# Patient Record
Sex: Male | Born: 1990 | Race: White | Hispanic: Yes | Marital: Married | Smoking: Former smoker
Health system: Southern US, Community
[De-identification: ages and names within clinical notes are randomized; demographics above are authoritative.]

## PROBLEM LIST (undated history)

## (undated) DIAGNOSIS — F32A Depression, unspecified: Secondary | ICD-10-CM

## (undated) DIAGNOSIS — T7840XA Allergy, unspecified, initial encounter: Secondary | ICD-10-CM

## (undated) DIAGNOSIS — F329 Major depressive disorder, single episode, unspecified: Secondary | ICD-10-CM

## (undated) DIAGNOSIS — I10 Essential (primary) hypertension: Secondary | ICD-10-CM

## (undated) HISTORY — DX: Depression, unspecified: F32.A

## (undated) HISTORY — DX: Allergy, unspecified, initial encounter: T78.40XA

## (undated) HISTORY — DX: Major depressive disorder, single episode, unspecified: F32.9

## (undated) HISTORY — PX: NO PAST SURGERIES: SHX2092

---
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
## 2011-01-17 ENCOUNTER — Emergency Department (HOSPITAL_COMMUNITY)
Admission: EM | Admit: 2011-01-17 | Discharge: 2011-01-18 | Disposition: A | Discharge status: Home / Self Care | Payer: BC Managed Care – PPO | Attending: Emergency Medicine | Admitting: Emergency Medicine

## 2011-01-17 ENCOUNTER — Encounter: Payer: Self-pay | Admitting: *Deleted

## 2011-01-17 DIAGNOSIS — J36 Peritonsillar abscess: Secondary | ICD-10-CM

## 2011-01-17 DIAGNOSIS — J029 Acute pharyngitis, unspecified: Secondary | ICD-10-CM | POA: Insufficient documentation

## 2011-01-17 MED ORDER — CLINDAMYCIN HCL 150 MG PO CAPS: 300.0000 mg | ORAL_CAPSULE | Freq: Four times a day (QID) | ORAL | Status: AC

## 2011-01-17 MED ORDER — DEXAMETHASONE 1 MG/ML PO CONC: 10.0000 mg | Freq: Once | ORAL | Status: DC

## 2011-01-17 MED ORDER — CLINDAMYCIN PHOSPHATE 600 MG/4ML IJ SOLN
INTRAMUSCULAR | Status: AC
  Filled 2011-01-17: qty 4

## 2011-01-17 MED ORDER — HYDROMORPHONE HCL PF 2 MG/ML IJ SOLN: 2.0000 mg | Freq: Once | INTRAMUSCULAR | Status: DC

## 2011-01-17 MED ORDER — DEXAMETHASONE 6 MG PO TABS
10.0000 mg | ORAL_TABLET | ORAL | Status: DC
  Filled 2011-01-17: qty 1

## 2011-01-17 MED ORDER — IBUPROFEN 600 MG PO TABS: 600.0000 mg | ORAL_TABLET | Freq: Four times a day (QID) | ORAL | Status: AC | PRN

## 2011-01-17 MED ORDER — DEXTROSE 5 % IV SOLN
INTRAVENOUS | Status: AC
  Filled 2011-01-17: qty 50

## 2011-01-17 MED ORDER — CLINDAMYCIN PHOSPHATE 600 MG/50ML IV SOLN
600.0000 mg | Freq: Once | INTRAVENOUS | Status: AC
  Administered 2011-01-17: 600 mg via INTRAVENOUS

## 2011-01-17 MED ORDER — HYDROCODONE-ACETAMINOPHEN 5-500 MG PO TABS: 1.0000 | ORAL_TABLET | Freq: Four times a day (QID) | ORAL | Status: AC | PRN

## 2011-01-17 MED ORDER — HYDROMORPHONE HCL PF 1 MG/ML IJ SOLN
1.0000 mg | Freq: Once | INTRAMUSCULAR | Status: AC
  Administered 2011-01-17: 1 mg via INTRAVENOUS
  Filled 2011-01-17: qty 1

## 2011-01-17 NOTE — ED Provider Notes (Signed)
I saw and evaluated the patient, reviewed the resident's note and I agree with the findings and plan. 20 year old, male, complains of sore throat since Friday.  Progressive.  No cough, no nausea, vomiting.  On examination, is significant tenderness.  Tremulousness, with right-sided tonsillar edema and uvula deviation to the left.  He got a classic hot potato voice.  This is all consistent with a peritonsillar abscess.  We will consult Dr. Jenne Pane for I&D of a peritonsillar abscess.  An effort to him whether or not.  He wants a CAT scan first  Nicholes Stairs, MD 01/17/11 2246

## 2011-01-17 NOTE — ED Provider Notes (Signed)
History     CSN: [id number] Arrival date & time: 01/17/2011  6:52 PM   First MD Initiated Contact with Patient 01/17/11 2156      Chief Complaint  Patient presents with  . Sore Throat    (Consider location/radiation/quality/duration/timing/severity/associated sxs/prior treatment) The history is provided by the patient.   Patient is a 20 year old male who presents with sore throat. Sore throat started about 5 days ago. Over the last couple days it has definitely worsened. It has been constant. His right side of his throat is sore. He has pain with swallowing but is able to tolerate by mouth. No change in voice. No trouble breathing. No fever. Mild pain with opening his mouth but able to open fully. No cough other respiratory complaints. No rash.  No sick contacts reported. Overall the severity is noted to be moderate. Nothing has been noted to make it better or worse.   History reviewed. No pertinent past medical history.  History reviewed. No pertinent past surgical history.  History reviewed. No pertinent family history.  History  Substance Use Topics  . Smoking status: Passive Smoker  . Smokeless tobacco: Not on file  . Alcohol Use:       Review of Systems  Constitutional: Negative for fever, chills and activity change.  HENT: Negative for congestion, neck pain and neck stiffness.   Respiratory: Negative for cough, chest tightness, shortness of breath and wheezing.   Cardiovascular: Negative for chest pain.  Gastrointestinal: Negative for nausea, vomiting, abdominal pain, diarrhea and abdominal distention.  Genitourinary: Negative for difficulty urinating.  Musculoskeletal: Negative for gait problem.  Skin: Negative for rash.  Neurological: Negative for weakness and numbness.  Psychiatric/Behavioral: Negative for behavioral problems and confusion.  All other systems reviewed and are negative.    Allergies  Review of patient's allergies indicates no known  allergies.  Home Medications   Current Outpatient Rx  Name Route Sig Dispense Refill  . AZITHROMYCIN 250 MG PO TABS Oral Take 250 mg by mouth daily. Take 2 on day 1, then 1 tab on days 2-5. Started on 12/10     . LORATADINE 10 MG PO TABS Oral Take 10 mg by mouth daily.      Marland Kitchen CLINDAMYCIN HCL 150 MG PO CAPS Oral Take 2 capsules (300 mg total) by mouth every 6 (six) hours. 80 capsule 0  . HYDROCODONE-ACETAMINOPHEN 5-500 MG PO TABS Oral Take 1-2 tablets by mouth every 6 (six) hours as needed for pain. 15 tablet 0  . IBUPROFEN 600 MG PO TABS Oral Take 1 tablet (600 mg total) by mouth every 6 (six) hours as needed for pain. 30 tablet 0    BP 136/82  Pulse 91  Temp(Src) 99.7 F (37.6 C) (Oral)  Resp 16  SpO2 98%  Physical Exam  Nursing note and vitals reviewed. Constitutional: He is oriented to person, place, and time. He appears well-developed and well-nourished. No distress.  HENT:  Head: Normocephalic.  Nose: Nose normal.       Right-sided pharyngeal erythema. Soft palate asymmetry. Fluctuance over her right soft palate. No respiratory compromise. No significant trismus.  Eyes: EOM are normal. Pupils are equal, round, and reactive to light.  Neck: Normal range of motion.       No lymphadenopathy  Cardiovascular: Regular rhythm and intact distal pulses.   Pulmonary/Chest: Effort normal and breath sounds normal. No respiratory distress. He has no wheezes. He has no rales.  Abdominal: He exhibits no distension.  No visible injury  Musculoskeletal: Normal range of motion. He exhibits no edema and no tenderness.  Neurological: He is alert and oriented to person, place, and time.       Normal strength  Skin: Skin is warm and dry. No rash noted. He is not diaphoretic.  Psychiatric: He has a normal mood and affect. His behavior is normal. Thought content normal.    ED Course  Procedures (including critical care time)  Labs Reviewed - No data to display No results found.   1.  Peritonsillar abscess       MDM   Patient with right peritonsillar abscess.  Patient is afebrile and nontoxic appearing. No respiratory compromise. Handling secretions and tolerating by mouth. I discussed patient's case with on-call ENT (Dr. Jenne Pane).  He recommends IV clindamycin, Decadron. He will see patient this morning in clinic in one drain PTA there. As mentioned, patient is well-appearing and having no respiratory difficulty and tolerating by mouth. Stable for discharge and close ENT followup. Return precautions discussed        Milus Glazier 01/18/11 0115

## 2011-01-17 NOTE — ED Notes (Addendum)
To ed for eval of sore throat. States he was seen by pmd yesterday and given meds for congestion. Fever on Sunday but not since. Airway patent. Difficulty swallowing per pt. Pt states right side of throat is very painful.

## 2011-01-18 NOTE — ED Provider Notes (Signed)
I saw and evaluated the patient, reviewed the resident's note and I agree with the findings and plan.  Jahaira Earnhart P Miosha Behe, MD 01/18/11 0936 

## 2013-02-14 ENCOUNTER — Encounter (HOSPITAL_COMMUNITY): Payer: Self-pay | Admitting: Emergency Medicine

## 2013-02-14 DIAGNOSIS — R55 Syncope and collapse: Secondary | ICD-10-CM | POA: Insufficient documentation

## 2013-02-14 DIAGNOSIS — R112 Nausea with vomiting, unspecified: Secondary | ICD-10-CM | POA: Insufficient documentation

## 2013-02-14 DIAGNOSIS — R197 Diarrhea, unspecified: Secondary | ICD-10-CM | POA: Insufficient documentation

## 2013-02-14 DIAGNOSIS — R51 Headache: Secondary | ICD-10-CM | POA: Insufficient documentation

## 2013-02-14 LAB — CBC WITH DIFFERENTIAL/PLATELET
Basophils Absolute: 0 10*3/uL (ref 0.0–0.1)
Basophils Relative: 0 % (ref 0–1)
EOS ABS: 0.1 10*3/uL (ref 0.0–0.7)
Eosinophils Relative: 1 % (ref 0–5)
HCT: 39.9 % (ref 39.0–52.0)
Hemoglobin: 14.5 g/dL (ref 13.0–17.0)
LYMPHS ABS: 3 10*3/uL (ref 0.7–4.0)
LYMPHS PCT: 35 % (ref 12–46)
MCH: 30.7 pg (ref 26.0–34.0)
MCHC: 36.3 g/dL — ABNORMAL HIGH (ref 30.0–36.0)
MCV: 84.4 fL (ref 78.0–100.0)
Monocytes Absolute: 0.9 10*3/uL (ref 0.1–1.0)
Monocytes Relative: 10 % (ref 3–12)
NEUTROS PCT: 53 % (ref 43–77)
Neutro Abs: 4.5 10*3/uL (ref 1.7–7.7)
PLATELETS: 223 10*3/uL (ref 150–400)
RBC: 4.73 MIL/uL (ref 4.22–5.81)
RDW: 12.4 % (ref 11.5–15.5)
WBC: 8.5 10*3/uL (ref 4.0–10.5)

## 2013-02-14 LAB — COMPREHENSIVE METABOLIC PANEL
ALK PHOS: 88 U/L (ref 39–117)
ALT: 20 U/L (ref 0–53)
AST: 19 U/L (ref 0–37)
Albumin: 3.9 g/dL (ref 3.5–5.2)
BUN: 19 mg/dL (ref 6–23)
CO2: 26 meq/L (ref 19–32)
Calcium: 9.2 mg/dL (ref 8.4–10.5)
Chloride: 97 mEq/L (ref 96–112)
Creatinine, Ser: 0.98 mg/dL (ref 0.50–1.35)
GFR calc non Af Amer: >90 mL/min (ref >90)
GLUCOSE: 91 mg/dL (ref 70–99)
POTASSIUM: 3.8 meq/L (ref 3.7–5.3)
SODIUM: 135 meq/L — AB (ref 137–147)
TOTAL PROTEIN: 7.4 g/dL (ref 6.0–8.3)
Total Bilirubin: 0.4 mg/dL (ref 0.3–1.2)

## 2013-02-14 LAB — LIPASE, BLOOD: Lipase: 37 U/L (ref 11–59)

## 2013-02-14 MED ORDER — ONDANSETRON 4 MG PO TBDP
8.0000 mg | ORAL_TABLET | Freq: Once | ORAL | Status: AC
  Administered 2013-02-15: 8 mg via ORAL
  Filled 2013-02-14: qty 2

## 2013-02-14 MED ORDER — ONDANSETRON HCL 4 MG/2ML IJ SOLN: 4.0000 mg | Freq: Once | INTRAMUSCULAR | Status: DC

## 2013-02-14 NOTE — ED Notes (Addendum)
BP really high - 160/102 and having h/a's . When coughing, black outs; vomiting diarrhea. Stop taking bp meds b/c he was feeling better.

## 2013-02-15 ENCOUNTER — Emergency Department (HOSPITAL_COMMUNITY)
Admission: EM | Admit: 2013-02-15 | Discharge: 2013-02-15 | Disposition: A | Discharge status: Home / Self Care | Payer: BC Managed Care – PPO | Attending: Emergency Medicine | Admitting: Emergency Medicine

## 2013-02-15 DIAGNOSIS — R197 Diarrhea, unspecified: Secondary | ICD-10-CM

## 2013-02-15 DIAGNOSIS — R112 Nausea with vomiting, unspecified: Secondary | ICD-10-CM

## 2013-02-15 HISTORY — DX: Essential (primary) hypertension: I10

## 2013-02-15 LAB — URINALYSIS, ROUTINE W REFLEX MICROSCOPIC
BILIRUBIN URINE: NEGATIVE
GLUCOSE, UA: NEGATIVE mg/dL
HGB URINE DIPSTICK: NEGATIVE
Ketones, ur: NEGATIVE mg/dL
Leukocytes, UA: NEGATIVE
Nitrite: NEGATIVE
Protein, ur: NEGATIVE mg/dL
SPECIFIC GRAVITY, URINE: 1.027 (ref 1.005–1.030)
UROBILINOGEN UA: 0.2 mg/dL (ref 0.0–1.0)
pH: 6 (ref 5.0–8.0)

## 2013-02-15 MED ORDER — SODIUM CHLORIDE 0.9 % IV BOLUS (SEPSIS)
1000.0000 mL | Freq: Once | INTRAVENOUS | Status: AC
  Administered 2013-02-15: 1000 mL via INTRAVENOUS

## 2013-02-15 MED ORDER — KETOROLAC TROMETHAMINE 30 MG/ML IJ SOLN
30.0000 mg | Freq: Once | INTRAMUSCULAR | Status: AC
  Administered 2013-02-15: 30 mg via INTRAVENOUS
  Filled 2013-02-15: qty 1

## 2013-02-15 MED ORDER — ONDANSETRON HCL 4 MG PO TABS: 4.0000 mg | ORAL_TABLET | Freq: Four times a day (QID) | ORAL | Status: DC

## 2013-02-15 NOTE — ED Provider Notes (Signed)
CSN: [id number]     Arrival date & time 02/14/13  2300 History   First MD Initiated Contact with Patient 02/15/13 0143     Chief Complaint  Patient presents with  . Hypertension  . Headache  . Emesis  . Diarrhea  . Near Syncope   (Consider location/radiation/quality/duration/timing/severity/associated sxs/prior Treatment) HPI Comments: Patient states, when he got home from work.  Tonight, he developed a headache, nausea, vomiting, and diarrhea.  Denies any fever, sick contacts.  States he has not taken his antihypertensives in over a year  Patient is a 23 y.o. male presenting with hypertension, headaches, vomiting, diarrhea, and near-syncope. The history is provided by the patient.  Hypertension This is a new problem. The current episode started today. The problem occurs intermittently. The problem has been unchanged. Associated symptoms include headaches, nausea and vomiting. Pertinent negatives include no abdominal pain, chills, fever or rash. Associated symptoms comments: Diarrhea. He has tried nothing for the symptoms. The treatment provided no relief.  Headache Associated symptoms: diarrhea, nausea, near-syncope and vomiting   Associated symptoms: no abdominal pain, no dizziness and no fever   Emesis Associated symptoms: diarrhea and headaches   Associated symptoms: no abdominal pain and no chills   Diarrhea Associated symptoms: headaches and vomiting   Associated symptoms: no abdominal pain, no chills and no fever   Near Syncope Associated symptoms include headaches, nausea and vomiting. Pertinent negatives include no abdominal pain, chills, fever or rash.    Past Medical History  Diagnosis Date  . Hypertension    History reviewed. No pertinent past surgical history. History reviewed. No pertinent family history. History  Substance Use Topics  . Smoking status: Passive Smoke Exposure - Never Smoker  . Smokeless tobacco: Not on file  . Alcohol Use: No    Review of  Systems  Unable to perform ROS Constitutional: Negative for fever and chills.  Cardiovascular: Positive for near-syncope.  Gastrointestinal: Positive for nausea, vomiting and diarrhea. Negative for abdominal pain.  Skin: Negative for rash and wound.  Neurological: Positive for headaches. Negative for dizziness.  All other systems reviewed and are negative.    Allergies  Review of patient's allergies indicates no known allergies.  Home Medications   Current Outpatient Rx  Name  Route  Sig  Dispense  Refill  . ondansetron (ZOFRAN) 4 MG tablet   Oral   Take 1 tablet (4 mg total) by mouth every 6 (six) hours.   12 tablet   0    BP 111/63  Pulse 65  Temp(Src) 97.7 F (36.5 C) (Oral)  Resp 18  Ht 6' (1.829 m)  Wt 228 lb (103.42 kg)  BMI 30.92 kg/m2  SpO2 95% Physical Exam  Nursing note and vitals reviewed. Constitutional: He is oriented to person, place, and time. He appears well-developed and well-nourished.  HENT:  Head: Normocephalic.  Eyes: Pupils are equal, round, and reactive to light.  Neck: Normal range of motion.  Cardiovascular: Normal rate and regular rhythm.   Pulmonary/Chest: Effort normal.  Abdominal: Soft.  Musculoskeletal: Normal range of motion.  Neurological: He is alert and oriented to person, place, and time.  Skin: Skin is warm and dry.    ED Course  Procedures (including critical care time) Labs Review Labs Reviewed  CBC WITH DIFFERENTIAL - Abnormal; Notable for the following:    MCHC 36.3 (*)    All other components within normal limits  COMPREHENSIVE METABOLIC PANEL - Abnormal; Notable for the following:    Sodium 135 (*)  All other components within normal limits  LIPASE, BLOOD  URINALYSIS, ROUTINE W REFLEX MICROSCOPIC   Imaging Review No results found.  EKG Interpretation   None       MDM   1. Nausea vomiting and diarrhea     Patient has had no further episodes of nausea, vomiting, or diarrhea.  Since being medicated in  the emergency department.  His headache is significantly improved after 1 L of fluid was then discharged home with a prescription for Zofran instructions to use over-the-counter Imodium if needed    Arman FilterGail K Steffany Schoenfelder, NP 02/15/13 0434  Arman FilterGail K Caytlyn Evers, NP 02/15/13 [phone]

## 2013-02-15 NOTE — Discharge Instructions (Signed)
Please try to eat lightly for the next given a prescription for Zofran that, you can use for any a set of nausea.  He can use over-the-counter ammonium for any more episodes of diarrhea.  Return if she cannot control either of these symptoms

## 2013-02-15 NOTE — ED Provider Notes (Signed)
Medical screening examination/treatment/procedure(s) were performed by non-physician practitioner and as supervising physician I was immediately available for consultation/collaboration.  Ramir Malerba L Leyland Kenna, MD 02/15/13 0722 

## 2013-06-19 ENCOUNTER — Other Ambulatory Visit: Payer: Self-pay

## 2013-06-19 ENCOUNTER — Emergency Department (HOSPITAL_COMMUNITY)
Admission: EM | Admit: 2013-06-19 | Discharge: 2013-06-20 | Disposition: A | Discharge status: Other Acute Inpatient Hospital | Payer: BC Managed Care – PPO | Attending: Emergency Medicine | Admitting: Emergency Medicine

## 2013-06-19 ENCOUNTER — Encounter (HOSPITAL_COMMUNITY): Payer: Self-pay | Admitting: Emergency Medicine

## 2013-06-19 DIAGNOSIS — F329 Major depressive disorder, single episode, unspecified: Secondary | ICD-10-CM | POA: Diagnosis present

## 2013-06-19 DIAGNOSIS — T1491XA Suicide attempt, initial encounter: Secondary | ICD-10-CM | POA: Diagnosis present

## 2013-06-19 DIAGNOSIS — I10 Essential (primary) hypertension: Secondary | ICD-10-CM | POA: Insufficient documentation

## 2013-06-19 DIAGNOSIS — T398X2A Poisoning by other nonopioid analgesics and antipyretics, not elsewhere classified, intentional self-harm, initial encounter: Secondary | ICD-10-CM

## 2013-06-19 DIAGNOSIS — T391X1A Poisoning by 4-Aminophenol derivatives, accidental (unintentional), initial encounter: Secondary | ICD-10-CM | POA: Insufficient documentation

## 2013-06-19 DIAGNOSIS — T394X2A Poisoning by antirheumatics, not elsewhere classified, intentional self-harm, initial encounter: Secondary | ICD-10-CM | POA: Insufficient documentation

## 2013-06-19 DIAGNOSIS — T391X2A Poisoning by 4-Aminophenol derivatives, intentional self-harm, initial encounter: Secondary | ICD-10-CM | POA: Diagnosis present

## 2013-06-19 LAB — CBC
HEMATOCRIT: 42 % (ref 39.0–52.0)
HEMOGLOBIN: 14.9 g/dL (ref 13.0–17.0)
MCH: 29.7 pg (ref 26.0–34.0)
MCHC: 35.5 g/dL (ref 30.0–36.0)
MCV: 83.7 fL (ref 78.0–100.0)
PLATELETS: 221 10*3/uL (ref 150–400)
RBC: 5.02 MIL/uL (ref 4.22–5.81)
RDW: 12.2 % (ref 11.5–15.5)
WBC: 8.1 10*3/uL (ref 4.0–10.5)

## 2013-06-19 LAB — RAPID URINE DRUG SCREEN, HOSP PERFORMED
Amphetamines: NOT DETECTED
BARBITURATES: NOT DETECTED
BENZODIAZEPINES: NOT DETECTED
Cocaine: NOT DETECTED
Opiates: NOT DETECTED
Tetrahydrocannabinol: NOT DETECTED

## 2013-06-19 LAB — COMPREHENSIVE METABOLIC PANEL
ALT: 16 U/L (ref 0–53)
AST: 18 U/L (ref 0–37)
Albumin: 4.5 g/dL (ref 3.5–5.2)
Alkaline Phosphatase: 80 U/L (ref 39–117)
BILIRUBIN TOTAL: 0.5 mg/dL (ref 0.3–1.2)
BUN: 25 mg/dL — AB (ref 6–23)
CALCIUM: 9.5 mg/dL (ref 8.4–10.5)
CO2: 24 meq/L (ref 19–32)
CREATININE: 1 mg/dL (ref 0.50–1.35)
Chloride: 102 mEq/L (ref 96–112)
GLUCOSE: 94 mg/dL (ref 70–99)
Potassium: 3.5 mEq/L — ABNORMAL LOW (ref 3.7–5.3)
Sodium: 141 mEq/L (ref 137–147)
Total Protein: 7.9 g/dL (ref 6.0–8.3)

## 2013-06-19 LAB — ACETAMINOPHEN LEVEL
ACETAMINOPHEN (TYLENOL), SERUM: 20.9 ug/mL (ref 10–30)
Acetaminophen (Tylenol), Serum: <15 ug/mL (ref 10–30)

## 2013-06-19 LAB — ETHANOL: Alcohol, Ethyl (B): <11 mg/dL (ref 0–11)

## 2013-06-19 LAB — SALICYLATE LEVEL

## 2013-06-19 MED ORDER — IBUPROFEN 200 MG PO TABS: 600.0000 mg | ORAL_TABLET | Freq: Three times a day (TID) | ORAL | Status: DC | PRN

## 2013-06-19 MED ORDER — LORAZEPAM 1 MG PO TABS: 1.0000 mg | ORAL_TABLET | Freq: Three times a day (TID) | ORAL | Status: DC | PRN

## 2013-06-19 MED ORDER — NICOTINE 21 MG/24HR TD PT24: 21.0000 mg | MEDICATED_PATCH | Freq: Every day | TRANSDERMAL | Status: DC

## 2013-06-19 MED ORDER — ONDANSETRON HCL 4 MG PO TABS: 4.0000 mg | ORAL_TABLET | Freq: Three times a day (TID) | ORAL | Status: DC | PRN

## 2013-06-19 MED ORDER — ALUM & MAG HYDROXIDE-SIMETH 200-200-20 MG/5ML PO SUSP: 30.0000 mL | ORAL | Status: DC | PRN

## 2013-06-19 NOTE — ED Provider Notes (Signed)
CSN: [id number]     Arrival date & time 06/19/13  1549 History   First MD Initiated Contact with Patient 06/19/13 1554     Chief Complaint  Patient presents with  . Drug Overdose     (Consider location/radiation/quality/duration/timing/severity/associated sxs/prior Treatment) Patient is a 23 y.o. male presenting with Overdose. The history is provided by the patient. The history is limited by the condition of the patient.  Drug Overdose  pt here after intentional OD of tylenol of an unknown amount x 12 hours--denies any other ingestions, states that this was a suicide attempt--denies any prior attempt--pt unccoperative with questions--denies any hallucinations, no recent illnesses--nothing makes his sx better or worse  Past Medical History  Diagnosis Date  . Hypertension    History reviewed. No pertinent past surgical history. No family history on file. History  Substance Use Topics  . Smoking status: Passive Smoke Exposure - Never Smoker  . Smokeless tobacco: Not on file  . Alcohol Use: No    Review of Systems  Unable to perform ROS     Allergies  Review of patient's allergies indicates no known allergies.  Home Medications   Prior to Admission medications   Medication Sig Start Date End Date Taking? Authorizing Provider  ibuprofen (ADVIL,MOTRIN) 800 MG tablet  06/13/13   Historical Provider, MD  ondansetron (ZOFRAN) 4 MG tablet Take 1 tablet (4 mg total) by mouth every 6 (six) hours. 02/15/13   Arman FilterGail K Schulz, NP   BP 132/69  Pulse 79  Temp(Src) 98.1 F (36.7 C) (Oral)  Resp 17  SpO2 99% Physical Exam  Nursing note and vitals reviewed. Constitutional: He is oriented to person, place, and time. He appears well-developed and well-nourished.  Non-toxic appearance. No distress.  HENT:  Head: Normocephalic and atraumatic.  Eyes: Conjunctivae, EOM and lids are normal. Pupils are equal, round, and reactive to light.  Neck: Normal range of motion. Neck supple. No tracheal  deviation present. No mass present.  Cardiovascular: Normal rate, regular rhythm and normal heart sounds.  Exam reveals no gallop.   No murmur heard. Pulmonary/Chest: Effort normal and breath sounds normal. No stridor. No respiratory distress. He has no decreased breath sounds. He has no wheezes. He has no rhonchi. He has no rales.  Abdominal: Soft. Normal appearance and bowel sounds are normal. He exhibits no distension. There is no tenderness. There is no rebound and no CVA tenderness.  Musculoskeletal: Normal range of motion. He exhibits no edema and no tenderness.  Neurological: He is alert and oriented to person, place, and time. He has normal strength. No cranial nerve deficit or sensory deficit. GCS eye subscore is 4. GCS verbal subscore is 5. GCS motor subscore is 6.  Skin: Skin is warm and dry. No abrasion and no rash noted.  Psychiatric: His affect is blunt. His speech is delayed. He is slowed. He expresses suicidal ideation. He expresses suicidal plans. He expresses no homicidal plans.    ED Course  Procedures (including critical care time) Labs Review Labs Reviewed  CBC  COMPREHENSIVE METABOLIC PANEL  ETHANOL  ACETAMINOPHEN LEVEL  SALICYLATE LEVEL  URINE RAPID DRUG SCREEN (HOSP PERFORMED)    Imaging Review No results found.   EKG Interpretation None      MDM   Final diagnoses:  None    Pt had 4 hr tylenol level that was nl--ivc filled out, bhh to place    Toy BakerAnthony T Yamin Swingler, MD 06/19/13 2340

## 2013-06-19 NOTE — ED Notes (Addendum)
PER EMS - pt from home, pt activated EMS, with c/o +SI, pt reports taking 3x500mg  Tylenol every hour since 0300 today and "the rest of the bottle" approx 1500 today.  Pt c/o "tingling all over body".  A+Ox4.  Calm/Coop.

## 2013-06-19 NOTE — ED Notes (Signed)
Pt belongings: one black and white phone, black shoes, blue shirt, underwear, black shorts, black socks. Taken to [facility] ED

## 2013-06-19 NOTE — ED Notes (Signed)
Shoes, pair of black shorts, black socks, black underwear, blue t-shirt, black and white iPhone 6 cell-phone

## 2013-06-19 NOTE — ED Notes (Addendum)
In error

## 2013-06-19 NOTE — ED Notes (Signed)
Pt given gingerale at this time, denies further needs/complaints currently.  Family remains at bedside.  NAD.

## 2013-06-20 ENCOUNTER — Encounter (HOSPITAL_COMMUNITY): Payer: Self-pay | Admitting: Psychiatry

## 2013-06-20 ENCOUNTER — Inpatient Hospital Stay (HOSPITAL_COMMUNITY)
Admission: AD | Admit: 2013-06-20 | Discharge: 2013-06-25 | DRG: 885 | Disposition: A | Discharge status: Home / Self Care | Payer: BC Managed Care – PPO | Source: Intra-hospital | Attending: Psychiatry | Admitting: Psychiatry

## 2013-06-20 ENCOUNTER — Encounter (HOSPITAL_COMMUNITY): Payer: Self-pay | Admitting: *Deleted

## 2013-06-20 DIAGNOSIS — F320 Major depressive disorder, single episode, mild: Principal | ICD-10-CM | POA: Diagnosis present

## 2013-06-20 DIAGNOSIS — F411 Generalized anxiety disorder: Secondary | ICD-10-CM | POA: Diagnosis present

## 2013-06-20 DIAGNOSIS — T1491XA Suicide attempt, initial encounter: Secondary | ICD-10-CM | POA: Diagnosis present

## 2013-06-20 DIAGNOSIS — F329 Major depressive disorder, single episode, unspecified: Secondary | ICD-10-CM

## 2013-06-20 DIAGNOSIS — R45851 Suicidal ideations: Secondary | ICD-10-CM

## 2013-06-20 DIAGNOSIS — T391X2A Poisoning by 4-Aminophenol derivatives, intentional self-harm, initial encounter: Secondary | ICD-10-CM

## 2013-06-20 DIAGNOSIS — I10 Essential (primary) hypertension: Secondary | ICD-10-CM | POA: Diagnosis present

## 2013-06-20 MED ORDER — SERTRALINE HCL 50 MG PO TABS
50.0000 mg | ORAL_TABLET | Freq: Every day | ORAL | Status: DC
  Administered 2013-06-20: 50 mg via ORAL
  Filled 2013-06-20: qty 1

## 2013-06-20 MED ORDER — POTASSIUM CHLORIDE CRYS ER 10 MEQ PO TBCR
10.0000 meq | EXTENDED_RELEASE_TABLET | Freq: Every day | ORAL | Status: DC
  Administered 2013-06-20: 10 meq via ORAL
  Filled 2013-06-20: qty 0.5
  Filled 2013-06-20: qty 1

## 2013-06-20 MED ORDER — MAGNESIUM HYDROXIDE 400 MG/5ML PO SUSP: 30.0000 mL | Freq: Every day | ORAL | Status: DC | PRN

## 2013-06-20 MED ORDER — SERTRALINE HCL 50 MG PO TABS
50.0000 mg | ORAL_TABLET | Freq: Every day | ORAL | Status: DC
  Administered 2013-06-21 – 2013-06-25 (×5): 50 mg via ORAL
  Filled 2013-06-20 (×6): qty 1
  Filled 2013-06-20: qty 3

## 2013-06-20 MED ORDER — ALUM & MAG HYDROXIDE-SIMETH 200-200-20 MG/5ML PO SUSP
30.0000 mL | ORAL | Status: DC | PRN
Start: 2013-06-20 — End: 2013-06-25

## 2013-06-20 MED ORDER — NICOTINE 21 MG/24HR TD PT24
21.0000 mg | MEDICATED_PATCH | Freq: Every day | TRANSDERMAL | Status: DC
Start: 2013-06-21 — End: 2013-06-25
  Filled 2013-06-20 (×7): qty 1

## 2013-06-20 MED ORDER — LORAZEPAM 1 MG PO TABS: 1.0000 mg | ORAL_TABLET | Freq: Three times a day (TID) | ORAL | Status: DC | PRN

## 2013-06-20 MED ORDER — IBUPROFEN 600 MG PO TABS
600.0000 mg | ORAL_TABLET | Freq: Four times a day (QID) | ORAL | Status: DC | PRN
  Administered 2013-06-21 – 2013-06-24 (×4): 600 mg via ORAL
  Filled 2013-06-20 (×4): qty 1

## 2013-06-20 NOTE — BH Assessment (Signed)
[facility] bed [facility]1 by Dr. Lucianne MussKumar to Dr. Elsie SaasJonnalagadda.  -Dossie ArbourAnthony Harry Bark, MA  Disposition [facility]

## 2013-06-20 NOTE — Consult Note (Signed)
Patient reports that he has a lot of financial stress, feels overwhelmed, overdosed in order to kill himself. Patient feels that he does not require inpatient stay and so patient was IVC as he needs inpatient hospitalization for stabilization treatment of depression along with safety

## 2013-06-20 NOTE — ED Notes (Signed)
Cancelled [facility] transportation. Pt transported by [employer]

## 2013-06-20 NOTE — Progress Notes (Signed)
Patient ID: Eric Osborne, male   DOB: 02-28-1990, 23 y.o.   MRN: [id number] D: Patient in dayroom on approach. Pt reports he is remorseful about what he did. Pt reports his family is very supportive and willing to help him once discharged. Pt minimizes his issues and reports he felt overwhelmed with life. Pt mood and affect appeared depressed and flat. Pt is observed in dayroom interacting well with peers. Pt denies SI/HI/AVH and pain. Pt attended evening wrap up group and engage in discussion. Pt denies any needs or concerns.  Cooperative with assessment. No acute distressed noted at this time.   A: Met with pt 1:1. Writer encouraged pt to discuss feelings. Pt encouraged to come to staff with any question or concerns.   R: Patient remains safe. He is complaint with medications and denies any adverse reaction. Continue current POC.

## 2013-06-20 NOTE — Consult Note (Signed)
Eric Osborne   Reason for Osborne:  Intentional overdose Referring Physician:  EDP  Eric Osborne is an 23 y.o. male. Total Time spent with patient: 15 minutes  Assessment: AXIS I:  Major Depression, single episode AXIS II:  Deferred AXIS III:   Past Medical History  Diagnosis Date  . Hypertension    AXIS IV:  other psychosocial or environmental problems, problems related to social environment and problems with primary support group AXIS V:  21-30 behavior considerably influenced by delusions or hallucinations OR serious impairment in judgment, communication OR inability to function in almost all areas  Plan:  Recommend psychiatric Inpatient admission when medically cleared. Dr. Dwyane Dee assessed the patient and concurs with the plan.  Subjective:   Eric Osborne is a 23 y.o. male patient admitted with depression and suicide attempt.  HPI:  Patient took an overdose of Tylenol because he was stressed from working so much and feeling like he could not make ends meet.  He has to pay a large amount of child support and works a lot to pay this bill which leaves him little money to live.  Fount minimizes his suicide attempt and even inappropriately smiles at times when he discusses his stressors.  Denies hallucinations and substance abuse. HPI Elements:   Location:  generalized. Quality:  acute. Severity:  severe. Timing:  constant. Duration:  few weeks. Context:  stressors.  Past Psychiatric History: Past Medical History  Diagnosis Date  . Hypertension     reports that he has been passively smoking.  He does not have any smokeless tobacco history on file. He reports that he does not drink alcohol or use illicit drugs. History reviewed. No pertinent family history.         Allergies:  No Known Allergies  ACT Assessment Complete:  Yes:    Educational Status    Risk to Self: Risk to self Is patient at risk for suicide?: No Substance abuse history and/or  treatment for substance abuse?: No  Risk to Others:    Abuse:    Prior Inpatient Therapy:    Prior Outpatient Therapy:    Additional Information:                    Objective: Blood pressure 100/69, pulse 60, temperature 97.5 F (36.4 C), temperature source Oral, resp. rate 16, SpO2 100.00%.There is no weight on file to calculate BMI. Results for orders placed during the hospital encounter of 06/19/13 (from the past 72 hour(s))  URINE RAPID DRUG SCREEN (HOSP PERFORMED)     Status: None   Collection Time    06/19/13  4:08 PM      Result Value Ref Range   Opiates NONE DETECTED  NONE DETECTED   Cocaine NONE DETECTED  NONE DETECTED   Benzodiazepines NONE DETECTED  NONE DETECTED   Amphetamines NONE DETECTED  NONE DETECTED   Tetrahydrocannabinol NONE DETECTED  NONE DETECTED   Barbiturates NONE DETECTED  NONE DETECTED   Comment:            DRUG SCREEN FOR MEDICAL PURPOSES     ONLY.  IF CONFIRMATION IS NEEDED     FOR ANY PURPOSE, NOTIFY LAB     WITHIN 5 DAYS.                LOWEST DETECTABLE LIMITS     FOR URINE DRUG SCREEN     Drug Class       Cutoff (ng/mL)     Amphetamine  1000     Barbiturate      200     Benzodiazepine   829     Tricyclics       937     Opiates          300     Cocaine          300     THC              50  CBC     Status: None   Collection Time    06/19/13  4:20 PM      Result Value Ref Range   WBC 8.1  4.0 - 10.5 K/uL   RBC 5.02  4.22 - 5.81 MIL/uL   Hemoglobin 14.9  13.0 - 17.0 g/dL   HCT 42.0  39.0 - 52.0 %   MCV 83.7  78.0 - 100.0 fL   MCH 29.7  26.0 - 34.0 pg   MCHC 35.5  30.0 - 36.0 g/dL   RDW 12.2  11.5 - 15.5 %   Platelets 221  150 - 400 K/uL  COMPREHENSIVE METABOLIC PANEL     Status: Abnormal   Collection Time    06/19/13  4:20 PM      Result Value Ref Range   Sodium 141  137 - 147 mEq/L   Potassium 3.5 (*) 3.7 - 5.3 mEq/L   Chloride 102  96 - 112 mEq/L   CO2 24  19 - 32 mEq/L   Glucose, Bld 94  70 - 99 mg/dL   BUN  25 (*) 6 - 23 mg/dL   Creatinine, Ser 1.00  0.50 - 1.35 mg/dL   Calcium 9.5  8.4 - 10.5 mg/dL   Total Protein 7.9  6.0 - 8.3 g/dL   Albumin 4.5  3.5 - 5.2 g/dL   AST 18  0 - 37 U/L   ALT 16  0 - 53 U/L   Alkaline Phosphatase 80  39 - 117 U/L   Total Bilirubin 0.5  0.3 - 1.2 mg/dL   GFR calc non Af Amer >90  >90 mL/min   GFR calc Af Amer >90  >90 mL/min   Comment: (NOTE)     The eGFR has been calculated using the CKD EPI equation.     This calculation has not been validated in all clinical situations.     eGFR's persistently <90 mL/min signify possible Chronic Kidney     Disease.  ETHANOL     Status: None   Collection Time    06/19/13  4:20 PM      Result Value Ref Range   Alcohol, Ethyl (B) <11  0 - 11 mg/dL   Comment:            LOWEST DETECTABLE LIMIT FOR     SERUM ALCOHOL IS 11 mg/dL     FOR MEDICAL PURPOSES ONLY  ACETAMINOPHEN LEVEL     Status: None   Collection Time    06/19/13  4:20 PM      Result Value Ref Range   Acetaminophen (Tylenol), Serum 20.9  10 - 30 ug/mL   Comment:            THERAPEUTIC CONCENTRATIONS VARY     SIGNIFICANTLY. A RANGE OF 10-30     ug/mL MAY BE AN EFFECTIVE     CONCENTRATION FOR MANY PATIENTS.     HOWEVER, SOME ARE BEST TREATED     AT CONCENTRATIONS OUTSIDE THIS  RANGE.     ACETAMINOPHEN CONCENTRATIONS     >150 ug/mL AT 4 HOURS AFTER     INGESTION AND >50 ug/mL AT 12     HOURS AFTER INGESTION ARE     OFTEN ASSOCIATED WITH TOXIC     REACTIONS.  SALICYLATE LEVEL     Status: Abnormal   Collection Time    06/19/13  4:20 PM      Result Value Ref Range   Salicylate Lvl <2.8 (*) 2.8 - 20.0 mg/dL  ACETAMINOPHEN LEVEL     Status: None   Collection Time    06/19/13  8:38 PM      Result Value Ref Range   Acetaminophen (Tylenol), Serum <15.0  10 - 30 ug/mL   Comment:            THERAPEUTIC CONCENTRATIONS VARY     SIGNIFICANTLY. A RANGE OF 10-30     ug/mL MAY BE AN EFFECTIVE     CONCENTRATION FOR MANY PATIENTS.     HOWEVER, SOME ARE  BEST TREATED     AT CONCENTRATIONS OUTSIDE THIS     RANGE.     ACETAMINOPHEN CONCENTRATIONS     >150 ug/mL AT 4 HOURS AFTER     INGESTION AND >50 ug/mL AT 12     HOURS AFTER INGESTION ARE     OFTEN ASSOCIATED WITH TOXIC     REACTIONS.   Labs are reviewed and are pertinent for no medical issues noted.  Current Facility-Administered Medications  Medication Dose Route Frequency Provider Last Rate Last Dose  . alum & mag hydroxide-simeth (MAALOX/MYLANTA) 200-200-20 MG/5ML suspension 30 mL  30 mL Oral PRN Leota Jacobsen, MD      . ibuprofen (ADVIL,MOTRIN) tablet 600 mg  600 mg Oral Q8H PRN Leota Jacobsen, MD      . LORazepam (ATIVAN) tablet 1 mg  1 mg Oral Q8H PRN Leota Jacobsen, MD      . nicotine (NICODERM CQ - dosed in mg/24 hours) patch 21 mg  21 mg Transdermal Daily Leota Jacobsen, MD      . ondansetron [facility]) tablet 4 mg  4 mg Oral Q8H PRN Leota Jacobsen, MD      . potassium chloride SA (K-DUR,KLOR-CON) CR tablet 10 mEq  10 mEq Oral Daily Waylan Boga, NP       Current Outpatient Prescriptions  Medication Sig Dispense Refill  . ibuprofen (ADVIL,MOTRIN) 800 MG tablet Take 800 mg by mouth every 6 (six) hours as needed for moderate pain.         Psychiatric Specialty Exam:     Blood pressure 100/69, pulse 60, temperature 97.5 F (36.4 C), temperature source Oral, resp. rate 16, SpO2 100.00%.There is no weight on file to calculate BMI.  General Appearance: Casual  Eye Contact::  Fair  Speech:  Normal Rate  Volume:  Normal  Mood:  Depressed  Affect: Incongruent  Thought Process:  Coherent  Orientation:  Full (Time, Place, and Person)  Thought Content:  WDL  Suicidal Thoughts:  Yes.  with intent/plan  Homicidal Thoughts:  No  Memory:  Immediate;   Fair Recent;   Fair Remote;   Fair  Judgement:  Fair  Insight:  Fair  Psychomotor Activity:  Decreased  Concentration:  Fair  Recall:  [employer] of Knowledge:Fair  Language: Fair  Akathisia:  No  Handed:  Right  AIMS  (if indicated):     Assets:  Physical Health Resilience Social Support  Sleep:  Musculoskeletal: Strength & Muscle Tone: within normal limits Gait & Station: normal Patient leans: N/A  Treatment Plan Summary: Daily contact with patient to assess and evaluate symptoms and progress in treatment Medication management; inpatient hospitalization for mood stability.  Waylan Boga, PMH-NP 06/20/2013 9:42 AM

## 2013-06-20 NOTE — Consult Note (Signed)
[facility], needed continued IVC as he overdosed, is depressed, overwhelmed but does not want to be hospitalized  Nelly RoutArchana Andreya Lacks, MD

## 2013-06-20 NOTE — Progress Notes (Signed)
Adult Psychoeducational Group Note  Date:  06/20/2013 Time:  8:30 PM  Group Topic/Focus:  Wrap-Up Group:   The focus of this group is to help patients review their daily goal of treatment and discuss progress on daily workbooks.  Participation Level:  Active  Participation Quality:  Appropriate and Attentive  Affect:  Appropriate  Cognitive:  Alert and Appropriate  Insight: Good  Engagement in Group:  Engaged  Modes of Intervention:  Discussion, Exploration, Socialization and Support  Additional Comments:  Pt came to group and shared that he had just arrived but he had a good visit with his family who are supportive. Pt had no negative concerns for the day.   Cathlean CowerCatherine Y Kila Godina 06/20/2013, 8:30 PM

## 2013-06-20 NOTE — Progress Notes (Signed)
23 year old male pt admitted on involuntary basis. On admission, pt reports that he overstated how many tylenol that he took and that he only took 3 or 4, he then got scared and called the police realizing that he really did not want to die. Pt also reports that he flushed the rest of the pills down the toilet. Pt reports that he became frustrated because he is working all the time and has no money and a lot of his money has been going for child support. Pt reports that he lives alone but his girlfriend is there most of the time. Pt also reports that she is a good support along with his parents. Pt denies any SI on admission and is able to contract for safety on the unit. Pt was oriented to the unit and safety maintained.

## 2013-06-20 NOTE — ED Notes (Signed)
Pt transferred from main ed, presents with complaint of SI thoughts, no plan.  Denies at present.  Denies HI or AV hallucinations.  No SI attempts in the past.  Denies hx of mental illness, reports he has been under a lot of stress and money problems.  Is currently employed with 1 daughter, but doesn't see her often.  Denies drugs or alcohol abuse.  Pt calm & cooperative at present.

## 2013-06-21 DIAGNOSIS — F329 Major depressive disorder, single episode, unspecified: Secondary | ICD-10-CM

## 2013-06-21 DIAGNOSIS — T394X2A Poisoning by antirheumatics, not elsewhere classified, intentional self-harm, initial encounter: Secondary | ICD-10-CM

## 2013-06-21 DIAGNOSIS — T391X1A Poisoning by 4-Aminophenol derivatives, accidental (unintentional), initial encounter: Secondary | ICD-10-CM

## 2013-06-21 DIAGNOSIS — T398X2A Poisoning by other nonopioid analgesics and antipyretics, not elsewhere classified, intentional self-harm, initial encounter: Secondary | ICD-10-CM

## 2013-06-21 MED ORDER — TRAZODONE HCL 50 MG PO TABS
50.0000 mg | ORAL_TABLET | Freq: Every evening | ORAL | Status: DC | PRN
  Filled 2013-06-21: qty 3

## 2013-06-21 NOTE — Progress Notes (Signed)
.  Psychoeducational Group Note    Date: 06/21/2013 Time:  0930  Goal Setting Purpose of Group: To be able to set a goal that is measurable and that can be accomplished in one day Participation Level:  Active  Participation Quality:  Appropriate  Affect:  Appropriate  Cognitive:  Oriented  Insight:  Improving  Engagement in Group:  Engaged  Additional Comments:  Pt was attentive and partisipated  Eric Osborne A  

## 2013-06-21 NOTE — H&P (Signed)
Psychiatric Admission Assessment Adult  Patient Identification:  Eric Osborne Date of Evaluation:  06/21/2013 Chief Complaint:  "I have been feeling very depressed."  History of Present Illness::  Eric Osborne is a 23 year old male who was brought to [facility] from home after calling EMS after a suicide attempt via overdose on tylenol. He reported tingling over his body when they arrived. The patient identified stressors that are mainly financial. Patient states today during his psychiatric assessment today "I have been depressed for a few weeks. I pay a large amount of child support. I have a good job. I just don't like asking for help. I enjoy working for nice things. But I am envious of others with more things. I need to live for my family. I feel bad for what I did. I miss my family. I did not realize that I had so much family support. I feel better now. I regret what I did. I have a daughter to live for. I realize now that my life is good." The patient appears to minimize the events that led to his admission. 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 Patient is taking the Zoloft but states "I'm not really that depressed now. Not sure I really need that." The patient was encouraged to look at the severity of his situation. He does admit to some symptoms of depression such as losing interest in activities and withdrawing from others. The patient denies any prior suicide attempts. He also now disputes the amount of Tylenol he took stating "It was only really like three tablets." The patient was encouraged to take time to look at the seriousness of his current problems. Eric Osborne denies ever having prior psychiatric problems.   Elements:  Location:  Depression, Suicidal thoughts. Quality:  Overdosed on few tylenol . Severity:  Moderate . Timing:  Last few weeks . Duration:  Acute . Context:  Financial stress, feeling overwhelmed depressed . Associated  Signs/Synptoms: Depression Symptoms:  depressed mood, anhedonia, psychomotor agitation, fatigue, feelings of worthlessness/guilt, difficulty concentrating, recurrent thoughts of death, suicidal attempt, anxiety, (Hypo) Manic Symptoms:  Denies Anxiety Symptoms:  Excessive Worry, Psychotic Symptoms:  Denies PTSD Symptoms: NA Total Time spent with patient: 45 minutes  Psychiatric Specialty Exam: Physical Exam  Constitutional:  Physical exam findings reviewed from the [facility] and I concur with no noted exceptions.     Review of Systems  Constitutional: Negative.   HENT: Negative.   Eyes: Negative.   Respiratory: Negative.   Cardiovascular: Negative.   Gastrointestinal: Negative.   Genitourinary: Negative.   Musculoskeletal: Negative.   Skin: Negative.   Neurological: Negative.   Endo/Heme/Allergies: Negative.   Psychiatric/Behavioral: Positive for depression and suicidal ideas. Negative for hallucinations, memory loss and substance abuse. The patient is nervous/anxious. The patient does not have insomnia.     Blood pressure 132/91, pulse 83, temperature 97.8 F (36.6 C), temperature source Oral, resp. rate 16, height 5' 10.5" (1.791 m), weight 95.709 kg (211 lb).Body mass index is 29.84 kg/(m^2).  General Appearance: Casual  Eye Contact::  Good  Speech:  Clear and Coherent  Volume:  Normal  Mood:  Anxious and Depressed  Affect:  Full Range  Thought Process:  Goal Directed and Intact  Orientation:  Full (Time, Place, and Person)  Thought Content:  Rumination  Suicidal Thoughts:  No  Homicidal Thoughts:  No  Memory:  Immediate;   Good Recent;   Good Remote;   Good  Judgement:  Impaired  Insight:  Shallow  Psychomotor Activity:  Increased  Concentration:  Fair  Recall:  Good  Fund of Knowledge:Good  Language: Good  Akathisia:  No  Handed:  Right  AIMS (if indicated):     Assets:  Communication Skills Desire for Improvement Financial  Resources/Insurance Housing Intimacy Leisure Time Physical Health Resilience Social Support  Sleep:       Musculoskeletal: Strength & Muscle Tone: within normal limits Gait & Station: normal Patient leans: N/A  Past Psychiatric History:Denies Diagnosis:  Hospitalizations:Denies  Outpatient Care:Denies  Substance Abuse Care: Denies  Self-Mutilation:Denies  Suicidal Attempts: Denies prior   Violent Behaviors:Denies   Past Medical History:   Past Medical History  Diagnosis Date  . Hypertension    None. Allergies:  No Known Allergies PTA Medications: Prescriptions prior to admission  Medication Sig Dispense Refill  . ibuprofen (ADVIL,MOTRIN) 800 MG tablet Take 800 mg by mouth every 6 (six) hours as needed for moderate pain.         Previous Psychotropic Medications:  Medication/Dose  Denies               Substance Abuse History in the last 12 months:  no  Consequences of Substance Abuse: NA  Social History:  reports that he has been passively smoking.  He does not have any smokeless tobacco history on file. He reports that he does not drink alcohol or use illicit drugs. Additional Social History:                      Current Place of Residence:   Place of Birth:   Family Members: Marital Status:  Single Children:  Sons:  Daughters: Relationships: Education:  Eric Osborne Problems/Performance: Religious Beliefs/Practices: History of Abuse (Emotional/Phsycial/Sexual) Ship broker History:  None. Legal History: Hobbies/Interests:  Family History:  History reviewed. No pertinent family history. Patient reports that his mother may have depression.   Results for orders placed during the hospital encounter of 06/19/13 (from the past 72 hour(s))  URINE RAPID DRUG SCREEN (HOSP PERFORMED)     Status: None   Collection Time    06/19/13  4:08 PM      Result Value Ref Range   Opiates NONE DETECTED  NONE  DETECTED   Cocaine NONE DETECTED  NONE DETECTED   Benzodiazepines NONE DETECTED  NONE DETECTED   Amphetamines NONE DETECTED  NONE DETECTED   Tetrahydrocannabinol NONE DETECTED  NONE DETECTED   Barbiturates NONE DETECTED  NONE DETECTED   Comment:            DRUG SCREEN FOR MEDICAL PURPOSES     ONLY.  IF CONFIRMATION IS NEEDED     FOR ANY PURPOSE, NOTIFY LAB     WITHIN 5 DAYS.                LOWEST DETECTABLE LIMITS     FOR URINE DRUG SCREEN     Drug Class       Cutoff (ng/mL)     Amphetamine      1000     Barbiturate      200     Benzodiazepine   784     Tricyclics       696     Opiates          300     Cocaine          300     THC  50  CBC     Status: None   Collection Time    06/19/13  4:20 PM      Result Value Ref Range   WBC 8.1  4.0 - 10.5 K/uL   RBC 5.02  4.22 - 5.81 MIL/uL   Hemoglobin 14.9  13.0 - 17.0 g/dL   HCT 42.0  39.0 - 52.0 %   MCV 83.7  78.0 - 100.0 fL   MCH 29.7  26.0 - 34.0 pg   MCHC 35.5  30.0 - 36.0 g/dL   RDW 12.2  11.5 - 15.5 %   Platelets 221  150 - 400 K/uL  COMPREHENSIVE METABOLIC PANEL     Status: Abnormal   Collection Time    06/19/13  4:20 PM      Result Value Ref Range   Sodium 141  137 - 147 mEq/L   Potassium 3.5 (*) 3.7 - 5.3 mEq/L   Chloride 102  96 - 112 mEq/L   CO2 24  19 - 32 mEq/L   Glucose, Bld 94  70 - 99 mg/dL   BUN 25 (*) 6 - 23 mg/dL   Creatinine, Ser 1.00  0.50 - 1.35 mg/dL   Calcium 9.5  8.4 - 10.5 mg/dL   Total Protein 7.9  6.0 - 8.3 g/dL   Albumin 4.5  3.5 - 5.2 g/dL   AST 18  0 - 37 U/L   ALT 16  0 - 53 U/L   Alkaline Phosphatase 80  39 - 117 U/L   Total Bilirubin 0.5  0.3 - 1.2 mg/dL   GFR calc non Af Amer >90  >90 mL/min   GFR calc Af Amer >90  >90 mL/min   Comment: (NOTE)     The eGFR has been calculated using the CKD EPI equation.     This calculation has not been validated in all clinical situations.     eGFR's persistently <90 mL/min signify possible Chronic Kidney     Disease.  ETHANOL      Status: None   Collection Time    06/19/13  4:20 PM      Result Value Ref Range   Alcohol, Ethyl (B) <11  0 - 11 mg/dL   Comment:            LOWEST DETECTABLE LIMIT FOR     SERUM ALCOHOL IS 11 mg/dL     FOR MEDICAL PURPOSES ONLY  ACETAMINOPHEN LEVEL     Status: None   Collection Time    06/19/13  4:20 PM      Result Value Ref Range   Acetaminophen (Tylenol), Serum 20.9  10 - 30 ug/mL   Comment:            THERAPEUTIC CONCENTRATIONS VARY     SIGNIFICANTLY. A RANGE OF 10-30     ug/mL MAY BE AN EFFECTIVE     CONCENTRATION FOR MANY PATIENTS.     HOWEVER, SOME ARE BEST TREATED     AT CONCENTRATIONS OUTSIDE THIS     RANGE.     ACETAMINOPHEN CONCENTRATIONS     >150 ug/mL AT 4 HOURS AFTER     INGESTION AND >50 ug/mL AT 12     HOURS AFTER INGESTION ARE     OFTEN ASSOCIATED WITH TOXIC     REACTIONS.  SALICYLATE LEVEL     Status: Abnormal   Collection Time    06/19/13  4:20 PM      Result Value Ref Range   Salicylate  Lvl <2.0 (*) 2.8 - 20.0 mg/dL  ACETAMINOPHEN LEVEL     Status: None   Collection Time    06/19/13  8:38 PM      Result Value Ref Range   Acetaminophen (Tylenol), Serum <15.0  10 - 30 ug/mL   Comment:            THERAPEUTIC CONCENTRATIONS VARY     SIGNIFICANTLY. A RANGE OF 10-30     ug/mL MAY BE AN EFFECTIVE     CONCENTRATION FOR MANY PATIENTS.     HOWEVER, SOME ARE BEST TREATED     AT CONCENTRATIONS OUTSIDE THIS     RANGE.     ACETAMINOPHEN CONCENTRATIONS     >150 ug/mL AT 4 HOURS AFTER     INGESTION AND >50 ug/mL AT 12     HOURS AFTER INGESTION ARE     OFTEN ASSOCIATED WITH TOXIC     REACTIONS.   Psychological Evaluations:  Assessment:   DSM5:  AXIS I:  Major Depression, single episode AXIS II:  Deferred AXIS III:   Past Medical History  Diagnosis Date  . Hypertension    AXIS IV:  economic problems, occupational problems and other psychosocial or environmental problems AXIS V:  41-50 serious symptoms   Treatment Plan/Recommendations:   1. Admit  for crisis management and stabilization. Estimated length of stay 5-7 days. 2. Medication management to reduce current symptoms to base line and improve the patient's level of functioning.  3. Develop treatment plan to decrease risk of relapse upon discharge of depressive symptoms and the need for readmission. 5. Group therapy to facilitate development of healthy coping skills to use for depression and anxiety. 6. Health care follow up as needed for medical problems.  7. Discharge plan to include therapy to help patient cope with stressors.  8. Call for Consult with Hospitalist for additional specialty patient services as needed.   Treatment Plan Summary: Daily contact with patient to assess and evaluate symptoms and progress in treatment Medication management Current Medications:  Current Facility-Administered Medications  Medication Dose Route Frequency Provider Last Rate Last Dose  . alum & mag hydroxide-simeth (MAALOX/MYLANTA) 200-200-20 MG/5ML suspension 30 mL  30 mL Oral Q4H PRN Nelly Rout, MD      . ibuprofen (ADVIL,MOTRIN) tablet 600 mg  600 mg Oral Q6H PRN Nelly Rout, MD   600 mg at 06/21/13 1425  . LORazepam (ATIVAN) tablet 1 mg  1 mg Oral Q8H PRN Nelly Rout, MD      . magnesium hydroxide (MILK OF MAGNESIA) suspension 30 mL  30 mL Oral Daily PRN Nelly Rout, MD      . nicotine (NICODERM CQ - dosed in mg/24 hours) patch 21 mg  21 mg Transdermal Q0600 Nelly Rout, MD      . sertraline (ZOLOFT) tablet 50 mg  50 mg Oral Daily Nelly Rout, MD   50 mg at 06/21/13 0904  . traZODone (DESYREL) tablet 50 mg  50 mg Oral QHS PRN Fransisca Kaufmann, NP        Observation Level/Precautions:  15 minute checks  Laboratory:  CBC Chemistry Profile UDS UA  Psychotherapy:  Individual and Group Therapy  Medications:  Zoloft 50 mg daily for depression, Ativan 1 mg every eight hours prn anxiety   Consultations:  As needed   Discharge Concerns:  Safety and Stability   Estimated LOS: 5-7  days  Other:  Increase collateral information from family    I certify that inpatient services furnished can reasonably  be expected to improve the patient's condition.   Elmarie Shiley NP-C 5/16/20154:22 PM  I have personally seen the patient and agreed with the findings and involved in the treatment plan. Berniece Andreas, MD

## 2013-06-21 NOTE — [facility] Group Notes (Signed)
[facility] LCSW Group Therapy Note  06/21/2013 1:15 PM  Type of Therapy and Topic:  Group Therapy: Avoiding Self-Sabotaging and Enabling Behaviors  Participation Level:  Active  Mood: Appropriate  Description of Group:     Learn how to identify obstacles, self-sabotaging and enabling behaviors, what are they, why do we do them and what needs do these behaviors meet? Discuss unhealthy relationships and how to have positive healthy boundaries with those that sabotage and enable. Explore aspects of self-sabotage and enabling in yourself and how to limit these self-destructive behaviors in everyday life.  Therapeutic Goals: 1. Patient will identify one obstacle that relates to self-sabotage and enabling behaviors 2. Patient will identify one personal self-sabotaging or enabling behavior they did prior to admission 3. Patient able to establish a plan to change the above identified behavior they did prior to admission:  4. Patient will demonstrate ability to communicate their needs through discussion and/or role plays.   Summary of Patient Progress: The main focus of today's process group was to explain what "self-sabotage" means and use Motivational Interviewing to discuss what benefits, negative or positive, were involved in a self-identified self-sabotaging behavior. We then talked about reasons the patient may want to change the behavior and his/her current desire to change. A scaling question was used to help patient look at where they are now in motivation for change, from 1 to 10 (lowest to highest motivation).  Greogry engaged easily in group discussion. He shared that he is highly motivated for change and realizes that he ofte responds to stress with negative self talk. He is looking forward to seeing his daughter next weekend, celebrating his birthday next month and returning to his job which he likes very much.    Therapeutic Modalities:   Cognitive Behavioral Therapy Person-Centered  Therapy Motivational Interviewing   Carney Bernatherine C Nyaja [place], LCSW

## 2013-06-21 NOTE — Progress Notes (Signed)
D) Pt requested Motrin for his tooth pain. States, "I feel better overall. After I took the overdose, I thought about my daughter and got very frightened amd called for help. I don't want to die. I need to live for my daughter". Mood and affect are appropriate. Pt rates his depression, hopelessness and helplessness all at a 0. Denies SI and HI. C/O a toothache. D) Has been given Motrin for the pain he rates a 6. given support, reassurance and praise. Encourged to go to the groups and to work on his packet he was given this morning. R) Denies SI and HI, delusions and hallucinations.

## 2013-06-21 NOTE — Progress Notes (Signed)
Patient has been up and active on the unit, attended group this evening and has voiced no complaints. Patient currently denies having pain, -si/hi/a/v hall. Support and encouragement offered, safety maintained on unit, will continue to monitor.  

## 2013-06-21 NOTE — [facility] Suicide Risk Assessment (Signed)
   Nursing information obtained from:    Demographic factors:    Current Mental Status:    Loss Factors:    Historical Factors:    Risk Reduction Factors:    Total Time spent with patient: 45 minutes  CLINICAL FACTORS:   Depression:   Anhedonia Hopelessness Impulsivity Insomnia Severe  Psychiatric Specialty Exam: Physical Exam  ROS  Blood pressure 132/91, pulse 83, temperature 97.8 F (36.6 C), temperature source Oral, resp. rate 16, height 5' 10.5" (1.791 m), weight 211 lb (95.709 kg).Body mass index is 29.84 kg/(m^2).  General Appearance: Casual and Fairly Groomed  Eye Contact::  Good  Speech:  Normal Rate and Slow  Volume:  Normal  Mood:  Anxious, Depressed and Dysphoric  Affect:  Appropriate  Thought Process:  Goal Directed and Logical  Orientation:  Full (Time, Place, and Person)  Thought Content:  Rumination  Suicidal Thoughts:  Yes.  with intent/plan  Homicidal Thoughts:  No  Memory:  Immediate;   Fair Recent;   Fair Remote;   Fair  Judgement:  Impaired  Insight:  Shallow  Psychomotor Activity:  Decreased and Restlessness  Concentration:  Fair  Recall:  FiservFair  Fund of Knowledge:Good  Language: Fair  Akathisia:  No  Handed:  Right  AIMS (if indicated):     Assets:  Communication Skills Desire for Improvement Social Support  Sleep:      Musculoskeletal: Strength & Muscle Tone: within normal limits Gait & Station: normal Patient leans: N/A  COGNITIVE FEATURES THAT CONTRIBUTE TO RISK:  Closed-mindedness Loss of executive function Polarized thinking Thought constriction (tunnel vision)    SUICIDE RISK:   Severe:  Frequent, intense, and enduring suicidal ideation, specific plan, no subjective intent, but some objective markers of intent (i.e., choice of lethal method), the method is accessible, some limited preparatory behavior, evidence of impaired self-control, severe dysphoria/symptomatology, multiple risk factors present, and few if any protective  factors, particularly a lack of social support.  PLAN OF CARE:  I certify that inpatient services furnished can reasonably be expected to improve the patient's condition.  Eric GroutSyed T Michaila Osborne 06/21/2013, 8:53 PM

## 2013-06-21 NOTE — Progress Notes (Signed)
Psychoeducational Group Note  Date: 06/21/2013 Time:  1015  Group Topic/Focus:  Identifying Needs:   The focus of this group is to help patients identify their personal needs that have been historically problematic and identify healthy behaviors to address their needs.  Participation Level:  Active  Participation Quality:  Appropriate  Affect:  Appropriate  Cognitive:  Oriented  Insight:  improving  Engagement in Group:  Engaged  Additional Comments:  Pt participated in the group Eric Osborne, Eric Osborne

## 2013-06-21 NOTE — Progress Notes (Signed)
The focus of this group is to help patients review their daily goal of treatment and discuss progress on daily workbooks. Pt attended the evening group session and responded to all discussion prompts from the Writer. Pt shared that today was a good day, the highlight of which was learning that he might be discharged Monday. "I feel good that it might be Monday." Pt also shared that upon discharge he planned to return to his job and attend follow-up appointments with a psychiatrist. Pt's affect was appropriate.

## 2013-06-22 DIAGNOSIS — F1020 Alcohol dependence, uncomplicated: Secondary | ICD-10-CM

## 2013-06-22 NOTE — Progress Notes (Signed)
Patient has been up and active on the unit and attended group this evening . Patient currently denies si/hi/a/v hall. He is hopeful to discharge on tomorrow. He c/o toothache pain and received ibuprofen ofr his pain. Writer will monitor effectiveness of medication.  Support and encouragement offered, safety maintained on unit, will continue to monitor.

## 2013-06-22 NOTE — Progress Notes (Signed)
[facility]  06/22/2013 4:35 PM Eric Osborne  MRN:  [id number]  Subjective: Eric Osborne reports, "I feel great. I can tell now that I was depressed and stressed, but did not know how to address it. I thought that my family did not care about me or what I was going through. That is why I tried to take the pills, realized it was not right. I did not want to die. So, I stopped. I know now that my family cared about me. I don't think that I need an antidepressant medicine, but I will take it if you think I need it. I will need a psychiatrist on an outpatient basis after discharge. I Sleep well and my appetite is good too".  Diagnosis:   DSM5: Schizophrenia Disorders:  NA Obsessive-Compulsive Disorders:  NA Trauma-Stressor Disorders:  NA Substance/Addictive Disorders:  NA Depressive Disorders:  Major Depressive Disorder - Mild (296.21) Total Time spent with patient: 35 minutes  Axis I: MDD (major depressive disorder) Axis II: Deferred Axis III:  Past Medical History  Diagnosis Date  . Hypertension    Axis IV: other psychosocial or environmental problems Axis V: 51-60 moderate symptoms  ADL's:  Intact  Sleep: Good  Appetite:  Good  Suicidal Ideation:  Plan:  Denies Intent:  Denies Means:  Denies Homicidal Ideation:  Plan:  Denies Intent:  denies Means:  Denies AEB (as evidenced by):  Psychiatric Specialty Exam: Physical Exam  Psychiatric: His speech is normal and behavior is normal. Judgment and thought content normal. His mood appears not anxious. His affect is not angry. Cognition and memory are normal. He exhibits a depressed mood (but improving).    Review of Systems  Constitutional: Negative.   HENT: Negative.   Eyes: Negative.   Respiratory: Negative.   Cardiovascular: Negative.   Gastrointestinal: Negative.   Genitourinary: Negative.   Musculoskeletal: Negative.   Skin: Negative.   Neurological: Negative.   Endo/Heme/Allergies: Negative.    Psychiatric/Behavioral: Positive for depression (currently being stabilizded with medication). Negative for suicidal ideas, hallucinations, memory loss and substance abuse. The patient is not nervous/anxious and does not have insomnia.     Blood pressure 110/70, pulse 97, temperature 97.9 F (36.6 C), temperature source Oral, resp. rate 16, height 5' 10.5" (1.791 m), weight 95.709 kg (211 lb).Body mass index is 29.84 kg/(m^2).  General Appearance: Casual and Fairly Groomed  Eye Contact::  Good  Speech:  Clear and Coherent  Volume:  Normal  Mood:  "Improving"  Affect:  Congruent  Thought Process:  Coherent, Goal Directed and Intact  Orientation:  Full (Time, Place, and Person)  Thought Content:  Denies any psychotic symptoms  Suicidal Thoughts:  No  Homicidal Thoughts:  No  Memory:  Immediate;   Good Recent;   Good Remote;   Good  Judgement:  Fair  Insight:  Fair  Psychomotor Activity:  Normal  Concentration:  Good  Recall:  Good  Fund of Knowledge:Fair  Language: Fair  Akathisia:  No  Handed:  Right  AIMS (if indicated):     Assets:  Communication Skills Desire for Improvement Physical Health  Sleep:  Number of Hours: 6.75   Musculoskeletal: Strength & Muscle Tone: within normal limits Gait & Station: normal Patient leans: N/A  Current Medications: Current Facility-Administered Medications  Medication Dose Route Frequency Provider Last Rate Last Dose  . alum & mag hydroxide-simeth (MAALOX/MYLANTA) 200-200-20 MG/5ML suspension 30 mL  30 mL Oral Q4H PRN Nelly RoutArchana Kumar, MD      .  ibuprofen (ADVIL,MOTRIN) tablet 600 mg  600 mg Oral Q6H PRN Nelly RoutArchana Kumar, MD   600 mg at 06/21/13 1425  . LORazepam (ATIVAN) tablet 1 mg  1 mg Oral Q8H PRN Nelly RoutArchana Kumar, MD      . magnesium hydroxide (MILK OF MAGNESIA) suspension 30 mL  30 mL Oral Daily PRN Nelly RoutArchana Kumar, MD      . nicotine (NICODERM CQ - dosed in mg/24 hours) patch 21 mg  21 mg Transdermal Q0600 Nelly RoutArchana Kumar, MD      .  sertraline (ZOLOFT) tablet 50 mg  50 mg Oral Daily Nelly RoutArchana Kumar, MD   50 mg at 06/22/13 0834  . traZODone (DESYREL) tablet 50 mg  50 mg Oral QHS PRN Fransisca KaufmannLaura Davis, NP        Lab Results: No results found for this or any previous visit (from the past 48 hour(s)).  Physical Findings: AIMS: Facial and Oral Movements Muscles of Facial Expression: None, normal Lips and Perioral Area: None, normal Jaw: None, normal Tongue: None, normal,Extremity Movements Upper (arms, wrists, hands, fingers): None, normal Lower (legs, knees, ankles, toes): None, normal, Trunk Movements Neck, shoulders, hips: None, normal, Overall Severity Severity of abnormal movements (highest score from questions above): None, normal Incapacitation due to abnormal movements: None, normal Patient's awareness of abnormal movements (rate only patient's report): No Awareness, Dental Status Current problems with teeth and/or dentures?: No Does patient usually wear dentures?: No  CIWA:    COWS:     Treatment Plan Summary: Daily contact with patient to assess and evaluate symptoms and progress in treatment Medication management  Plan: 1. Continue crisis management and stabilization.  2. Medication management: Continue Sertraline 50 mg daily for depression/anxiety and Trazodone 50 mg Q bedtime for sleep. Encouraged to continue the antidepressant for at least 3 months after discharge and have his outpatient psychiatrist evaluate his progress and adjust dose accordingly. 3. Encouraged patient to attend groups and participate in group counseling sessions and activities.  4. Continue current treatment plan.  5. Address health issues: Vitals reviewed and stable.   Medical Decision Making Problem Points:  Review of last therapy session (1) and Review of psycho-social stressors (1) Data Points:  Review of medication regiment & side effects (2) Review of new medications or change in dosage (2)  I certify that inpatient services  furnished can reasonably be expected to improve the patient's condition.  Sanjuana Kavagnes I Nwoko, PMHNP-BC 06/22/2013, 4:35 PM  I agreed with the findings, treatment and disposition plan of this patient. Kathryne SharperSyed Dejanae Helser, MD

## 2013-06-22 NOTE — Progress Notes (Signed)
Psychoeducational Group Note  Date: 06/22/2013 Time  0930  Group Topic/Focus:  Gratefulness:  The focus of this group is to help patients identify what two things they are most grateful for in their lives. What helps ground them and to center them on their work to their recovery.  Participation Level:  Active  Participation Quality:  Appropriate  Affect:  Appropriate  Cognitive:  Oriented  Insight:  Improving  Engagement in Group:  Improving  Additional Comments:  Pt participated in the group.  Dalia Jollie A   

## 2013-06-22 NOTE — Progress Notes (Signed)
D) Pt has been attending the groups and interacting with his peers. Mood and affect are appropriate. Pt rates his depression and hopelessness both at a 1 and denies SI and HI.   A) Given support, reassurance and praise. Encouragement given. R) Pt denies Si and HI.

## 2013-06-22 NOTE — Progress Notes (Signed)
Adult Psychoeducational Group Note  Date:  06/22/2013 Time:  9:32 PM  Group Topic/Focus:  Wrap-Up Group:   The focus of this group is to help patients review their daily goal of treatment and discuss progress on daily workbooks.  Participation Level:  Active  Participation Quality:  Appropriate  Affect:  Appropriate  Cognitive:  Appropriate  Insight: Appropriate and Good  Engagement in Group:  Engaged  Modes of Intervention:  Clarification, Discussion and Exploration  Additional Comments:  Pt actively participated in group with MHT. Pt discussed appreciating his family's support during his stay and how much it has meant to him. Pt currently denies SI/HI.   Gunnar FusiRobert O Kennita Pavlovich 06/22/2013, 9:32 PM

## 2013-06-22 NOTE — [facility] Counselor (Signed)
Adult Comprehensive Assessment  Patient ID: Eric Osborne, male   DOB: 08/22/1990, 23 y.o.   MRN: [id number]  Information Source: Information source: Patient  Current Stressors:  Educational / Learning stressors: NA Employment / Job issues: NA Family Relationships: NA Surveyor, quantityinancial / Lack of resources (include bankruptcy): Pt reports some stress with large outlay for child support, later reports "I have enough" Housing / Lack of housing: NA Physical health (include injuries & life threatening diseases): NA Social relationships: NA Substance abuse: NA Bereavement / Loss: NA  Living/Environment/Situation:  Living Arrangements: Spouse/significant other Living conditions (as described by patient or guardian): Good, nice apartment he shares with girlfriend of 2.5 years How long has patient lived in current situation?: 2.5 years What is atmosphere in current home: Supportive;Loving;Comfortable  Family History:  Marital status: Long term relationship Long term relationship, how long?: 2.5 years What types of issues is patient dealing with in the relationship?: NA Additional relationship information: Good relationship w supportive girlfriend Does patient have children?: Yes How many children?: 1 How is patient's relationship with their children?: Good with daughter he sees every other weekend  Childhood History:  By whom was/is the patient raised?: Both parents Additional childhood history information: Good, father was breadwinner and pt sometimes feels guilty he cannot provide as well as father did Description of patient's relationship with caregiver when they were a child: Great Patient's description of current relationship with people who raised him/her: Remains very good Does patient have siblings?: Yes Number of Siblings: 4 Description of patient's current relationship with siblings: good with all, yet especially close to oldest sister Did patient suffer any  verbal/emotional/physical/sexual abuse as a child?: No Did patient suffer from severe childhood neglect?: No Has patient ever been sexually abused/assaulted/raped as an adolescent or adult?: No Was the patient ever a victim of a crime or a disaster?: No Witnessed domestic violence?: No Has patient been effected by domestic violence as an adult?: No  Education:  Highest grade of school patient has completed: 12 Currently a Consulting civil engineerstudent?: No Learning disability?: No  Employment/Work Situation:   Employment situation: Employed Where is patient currently employed?: Engineer, 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[employer] How long has patient been employed?: 5 years; 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, 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 Patient's job has been impacted by current illness: No What is the longest time patient has a held a job?: 5 years Where was the patient employed at that time?: current job Has patient ever been in the Eli Lilly and Companymilitary?: No Has patient ever served in combat?: No  Financial Resources:   Financial resources: Income from employment;Income from spouse Does patient have a representative payee or guardian?: No  Alcohol/Substance Abuse:   What has been your use of drugs/alcohol within the last 12 months?: Occasional glass of wine with dinner or a celebration such as brother's recent birthday in April where he had 2 beers at the party Alcohol/Substance Abuse Treatment Hx: Denies past history Has alcohol/substance abuse ever caused legal problems?: No  Social Support System:   Forensic psychologistatient's Community Support System: Production assistant, radioGood Describe Community Support System: Girlfriend, parents, siblings How does patient's faith help to cope with current illness?: NA  Leisure/Recreation:   Leisure and Hobbies: Likes spending time with daughter and bowling  Strengths/Needs:   What things does the patient do well?: Technology In what areas does patient struggle / problems for patient: Negative self talk and money  Discharge  Plan:   Does patient have access to transportation?: Yes Will patient be  returning to same living situation after discharge?: Yes Currently receiving community mental health services: No If no, would patient like referral for services when discharged?: Yes (What county?) [facility] ;late afternoon appointments if at all possible) Does patient have financial barriers related to discharge medications?: No  Summary/Recommendations:   Summary and Recommendations (to be completed by the evaluator): Patient is a 23 YO single employed male admitted with diagnosis of Major Depression, Single Episode. Patient would benefit from crisis stabilization, medication evaluation, therapy groups for processing thoughts/feelings/experiences, psycho ed groups for increasing coping skills, and aftercare planning  Julious Payeratherine Campbell Cray Monnin. 06/22/2013

## 2013-06-22 NOTE — [facility] Group Notes (Signed)
[facility] LCSW Group Therapy Note   06/22/2013  1:15 to 2:05 PM  Type of Therapy and Topic: Group Therapy: Feelings Around Returning Home & Establishing a Supportive Framework and Activity to Identify signs of Improvement or Decompensation   Participation Level:  Active  Mood: Appropriate  Description of Group:  Patients first processed thoughts and feelings about up coming discharge. These included fears of upcoming changes, lack of change, new living environments, judgements and expectations from others and overall stigma of MH issues. We then discussed what is a supportive framework? What does it look like feel like and how do I discern it from and unhealthy non-supportive network? Learn how to cope when supports are not helpful and don't support you. Discuss what to do when your family/friends are not supportive.   Therapeutic Goals Addressed in Processing Group:  1. Patient will identify one healthy supportive network that they can use at discharge. 2. Patient will identify one factor of a supportive framework and how to tell it from an unhealthy network. 3. Patient able to identify one coping skill to use when they do not have positive supports from others. 4. Patient will demonstrate ability to communicate their needs through discussion and/or role plays.  Summary of Patient Progress:  Pt engaged easily during group session as other patients processed their anxiety about discharge and described healthy supports. He shared no concerns re discharge yet feels he may have trouble remembering to stop the negative self talk. Willing to work with therapist upon discharge. Patient feels he has gained a new and better perspective while here.   Carney Bernatherine C Adonias Demore, LCSW

## 2013-06-22 NOTE — Progress Notes (Signed)
Psychoeducational Group Note  Date:  06/22/2013 Time:  1015  Group Topic/Focus:  Making Healthy Choices:   The focus of this group is to help patients identify negative/unhealthy choices they were using prior to admission and identify positive/healthier coping strategies to replace them upon discharge.  Participation Level:  Active  Participation Quality:  Appropriate  Affect:  Appropriate  Cognitive:  Oriented  Insight:  Improving  Engagement in Group:  Engaged  Additional Comments:  Pt engaged in the group   Braylan Faul A 06/22/2013 

## 2013-06-23 MED ORDER — POTASSIUM CHLORIDE CRYS ER 10 MEQ PO TBCR
10.0000 meq | EXTENDED_RELEASE_TABLET | Freq: Two times a day (BID) | ORAL | Status: DC
  Administered 2013-06-23 – 2013-06-25 (×5): 10 meq via ORAL
  Filled 2013-06-23 (×10): qty 1

## 2013-06-23 NOTE — [facility] Group Notes (Signed)
[facility] LCSW Group Therapy  06/23/2013   1:15 PM   Type of Therapy:  Group Therapy  Participation Level:  Active  Participation Quality:  Attentive, Sharing and Supportive  Affect:  Calm  Cognitive:  Alert and Oriented  Insight:  Developing/Improving and Engaged  Engagement in Therapy:  Developing/Improving and Engaged  Modes of Intervention:  Clarification, Confrontation, Discussion, Education, Exploration, Limit-setting, Orientation, Problem-solving, Rapport Building, Dance movement psychotherapisteality Testing, Socialization and Support  Summary of Progress/Problems: Pt identified obstacles faced currently and processed barriers involved in overcoming these obstacles. Pt identified steps necessary for overcoming these obstacles and explored motivation (internal and external) for facing these difficulties head on. Pt further identified one area of concern in their lives and chose a goal to focus on for today. Pt shared that his biggest obstacle is money.  Pt explained that he makes good money and has been worried more about material things instead of his basic needs and spending time with his daughter.  Pt states that he plans to budget and manage his money better and focus on his loved ones in life.  Pt actively participated and was engaged in group discussion.    Eric IvanChelsea Horton, LCSW 06/23/2013  2:59 PM

## 2013-06-23 NOTE — Progress Notes (Signed)
Adult Psychoeducational Group Note  Date:  06/23/2013 Time:  10:00am  Group Topic/Focus:  Wellness Toolbox:   The focus of this group is to discuss various aspects of wellness, balancing those aspects and exploring ways to increase the ability to experience wellness.  Patients will create a wellness toolbox for use upon discharge.  Participation Level:  Active  Participation Quality:  Appropriate and Attentive  Affect:  Appropriate  Cognitive:  Alert and Appropriate  Insight: Appropriate  Engagement in Group:  Engaged  Modes of Intervention:  Discussion and Education  Additional Comments:  Pt attended and participated in group. Discussion was on Wellness and what it means to you. Pt stated wellness means being healthy with family and working.  Pryor Curiaanya D Garner 06/23/2013, 6:28 PM

## 2013-06-23 NOTE — [facility] Group Notes (Signed)
[facility]   06/23/2013 8:45 AM  Participation Quality:  Alert, Appropriate and Oriented  Mood/Affect:  Calm  Depression Rating:  0  Anxiety Rating:  0  Thoughts of Suicide:  Pt denies SI/HI  Will you contract for safety?   Yes  Current AVH:  Pt denies  Plan for Discharge/Comments:  Pt attended discharge planning group and actively participated in group.  CSW provided pt with today's workbook.  Pt reports feeling well today.  Pt states that he plans to return home with [place]'s support in [place].  CSW will assess for appropriate referrals.  No further needs voiced by pt at this time.    Transportation Means: Pt reports access to transportation - girlfriend or sister will pick pt up  Supports: Girlfriend and sister are supportive  Reyes IvanChelsea Horton, LCSW 06/23/2013 9:36 AM

## 2013-06-23 NOTE — Progress Notes (Signed)
Patient ID: Eric Osborne, male   DOB: 09-11-1990, 23 y.o.   MRN: [id number] [facility]  06/23/2013 4:40 PM Eric Osborne  MRN:  [id number]  Subjective: Eric Osborne was seen today to discuss his response to treatment and progress. He has been up and active on the unit and states he has been to all of his groups and has not missed any. He states that he slept well and his appetite is fine. He is hoping to go home tomorrow.  He says his depression is a 0/10 and his anxiety is a 1-2 /10, which he notes is a big improvement over when he arrived.  Eric Osborne also states that he doesn't want to take any anxiety medication but he is ok with taking the zoloft. Diagnosis:   DSM5: Schizophrenia Disorders:  NA Obsessive-Compulsive Disorders:  NA Trauma-Stressor Disorders:  NA Substance/Addictive Disorders:  NA Depressive Disorders:  Major Depressive Disorder - Mild (296.21) Total Time spent with patient: 35 minutes  Axis I: MDD (major depressive disorder) Axis II: Deferred Axis III:  Past Medical History  Diagnosis Date  . Hypertension    Axis IV: other psychosocial or environmental problems Axis V: 51-60 moderate symptoms  ADL's:  Intact  Sleep: Good  Appetite:  Good  Suicidal Ideation:  Plan:  Denies Intent:  Denies Means:  Denies Homicidal Ideation:  Plan:  Denies Intent:  denies Means:  Denies AEB (as evidenced by):  Psychiatric Specialty Exam: Physical Exam  Psychiatric: His speech is normal and behavior is normal. Judgment and thought content normal. His mood appears not anxious. His affect is not angry. Cognition and memory are normal. He exhibits a depressed mood (but improving).    Review of Systems  Constitutional: Negative.   HENT: Negative.   Eyes: Negative.   Respiratory: Negative.   Cardiovascular: Negative.   Gastrointestinal: Negative.   Genitourinary: Negative.   Musculoskeletal: Negative.   Skin: Negative.   Neurological: Negative.    Endo/Heme/Allergies: Negative.   Psychiatric/Behavioral: Positive for depression (currently being stabilizded with medication). Negative for suicidal ideas, hallucinations, memory loss and substance abuse. The patient is not nervous/anxious and does not have insomnia.     Blood pressure 120/83, pulse 68, temperature 97.2 F (36.2 C), temperature source Oral, resp. rate 16, height 5' 10.5" (1.791 m), weight 95.709 kg (211 lb).Body mass index is 29.84 kg/(m^2).  General Appearance: Casual and Fairly Groomed  Eye Contact::  Good  Speech:  Clear and Coherent  Volume:  Normal  Mood:  "Improving"  Affect:  Congruent  Thought Process:  Coherent, Goal Directed and Intact  Orientation:  Full (Time, Place, and Person)  Thought Content:  Denies any psychotic symptoms  Suicidal Thoughts:  No  Homicidal Thoughts:  No  Memory:  Immediate;   Good Recent;   Good Remote;   Good  Judgement:  Fair  Insight:  Fair  Psychomotor Activity:  Normal  Concentration:  Good  Recall:  Good  Fund of Knowledge:Fair  Language: Fair  Akathisia:  No  Handed:  Right  AIMS (if indicated):     Assets:  Communication Skills Desire for Improvement Physical Health  Sleep:  Number of Hours: 6.5   Musculoskeletal: Strength & Muscle Tone: within normal limits Gait & Station: normal Patient leans: N/A  Current Medications: Current Facility-Administered Medications  Medication Dose Route Frequency Provider Last Rate Last Dose  . alum & mag hydroxide-simeth (MAALOX/MYLANTA) 200-200-20 MG/5ML suspension 30 mL  30 mL Oral Q4H PRN Nelly RoutArchana Kumar, MD      .  ibuprofen (ADVIL,MOTRIN) tablet 600 mg  600 mg Oral Q6H PRN Nelly RoutArchana Kumar, MD   600 mg at 06/22/13 2133  . LORazepam (ATIVAN) tablet 1 mg  1 mg Oral Q8H PRN Nelly RoutArchana Kumar, MD      . magnesium hydroxide (MILK OF MAGNESIA) suspension 30 mL  30 mL Oral Daily PRN Nelly RoutArchana Kumar, MD      . nicotine (NICODERM CQ - dosed in mg/24 hours) patch 21 mg  21 mg Transdermal Q0600  Nelly RoutArchana Kumar, MD      . potassium chloride (K-DUR,KLOR-CON) CR tablet 10 mEq  10 mEq Oral BID Verne SpurrNeil Mashburn, PA-C   10 mEq at 06/23/13 1045  . sertraline (ZOLOFT) tablet 50 mg  50 mg Oral Daily Nelly RoutArchana Kumar, MD   50 mg at 06/23/13 0751  . traZODone (DESYREL) tablet 50 mg  50 mg Oral QHS PRN Fransisca KaufmannLaura Davis, NP        Lab Results: No results found for this or any previous visit (from the past 48 hour(s)).  Physical Findings: AIMS: Facial and Oral Movements Muscles of Facial Expression: None, normal Lips and Perioral Area: None, normal Jaw: None, normal Tongue: None, normal,Extremity Movements Upper (arms, wrists, hands, fingers): None, normal Lower (legs, knees, ankles, toes): None, normal, Trunk Movements Neck, shoulders, hips: None, normal, Overall Severity Severity of abnormal movements (highest score from questions above): None, normal Incapacitation due to abnormal movements: None, normal Patient's awareness of abnormal movements (rate only patient's report): No Awareness, Dental Status Current problems with teeth and/or dentures?: No Does patient usually wear dentures?: No  CIWA:    COWS:     Treatment Plan Summary: Daily contact with patient to assess and evaluate symptoms and progress in treatment Medication management  Plan:  1. Continue crisis management and stabilization.  2. Medication management: Continue Sertraline 50 mg daily for depression/anxiety and Trazodone 50 mg Q bedtime for sleep. Encouraged to continue the antidepressant for at least 3 months after discharge and have his outpatient psychiatrist evaluate his progress and adjust dose accordingly. 3. Encouraged patient to attend groups and participate in group counseling sessions and activities.  4. Continue current treatment plan.  5. Address health issues: Vitals reviewed and stable.   Medical Decision Making Problem Points:  Review of last therapy session (1) and Review of psycho-social stressors (1) Data  Points:  Review of medication regiment & side effects (2) Review of new medications or change in dosage (2)  I certify that inpatient services furnished can reasonably be expected to improve the patient's condition.    Rona RavensNeil T. Mashburn RPAC 4:45 PM 06/23/2013 Agree with assessment and plan Madie RenoIrving A. [place], [place] D.

## 2013-06-23 NOTE — Tx Team (Signed)
Interdisciplinary Treatment Plan Update (Adult)  Date: 06/23/2013  Time Reviewed:  9:45 AM  Progress in Treatment: Attending groups: Yes Participating in groups:  Yes Taking medication as prescribed:  Yes Tolerating medication:  Yes Family/Significant othe contact made: CSW assessing Patient understands diagnosis:  Yes Discussing patient identified problems/goals with staff:  Yes Medical problems stabilized or resolved:  Yes Denies suicidal/homicidal ideation: Yes Issues/concerns per patient self-inventory:  Yes Other:  New problem(s) identified: N/A  Discharge Plan or Barriers: CSW is assessing for appropriate referrals.    Reason for Continuation of Hospitalization: Anxiety Depression Medication Stabilization  Comments: N/A  Estimated length of stay: 3-5 days  For review of initial/current patient goals, please see plan of care.  Attendees: Patient:     Family:     Physician:     Nursing:   Christa Dopson, RN 06/23/2013 10:24 AM   Clinical Social Worker:  Stephane Niemann Horton, LCSW 06/23/2013 10:24 AM   Other: Neil Mashburn, PA 06/23/2013 10:24 AM   Other:  Beverly Knight, RN 06/23/2013 10:24 AM   Other:  Carol Davis, RN 06/23/2013 10:24 AM   Other:  Jennifer Clark, UR case manager 06/23/2013 10:25 AM   Other:    Other:    Other:    Other:    Other:    Other:     Scribe for Treatment Team:   Horton, Brezlyn Manrique Nicole, 06/23/2013 10:24 AM   

## 2013-06-23 NOTE — Progress Notes (Signed)
Patient ID: Eric Osborne, male   DOB: 1990-06-18, 23 y.o.   MRN: [id number]  D: Pt. Denies SI/HI and A/V Hallucinations. Patient reports that he has "no depression at all" and "no hopelessness at all." Patient does not report any pain or discomfort at this time. Patient reports that he slept well last night and his appetite is good.  A: Support and encouragement provided to the patient. Scheduled medications administered to patient per physician's orders.  R: Patient is receptive and cooperative. Patient is seen in the milieu and is attending groups.Q15 minute checks are maintained for safety.

## 2013-06-24 NOTE — Progress Notes (Signed)
Recreation Therapy Notes  Animal-Assisted Activity/Therapy (AAA/T) Program Checklist/Progress Notes Patient Eligibility Criteria Checklist & Daily Group note for Rec Tx Intervention  Date: 05.19.2015 Time: 2:45pm Location: [address] Dayroom    AAA/T Program Assumption of Risk Form signed by Patient/ or Parent Legal Guardian yes  Patient is free of allergies or sever asthma yes  Patient reports no fear of animals yes  Patient reports no history of cruelty to animals yes   Patient understands his/her participation is voluntary yes  Patient washes hands before animal contact yes  Patient washes hands after animal contact yes  Behavioral Response: Appropriate   Education: Hand Washing, Appropriate Animal Interaction   Education Outcome: Acknowledges understanding  Clinical Observations/Feedback: Patient engaged with therapy dog and peers appropriately.   Kinzlie Harney L Johniya Durfee, LRT/CTRS  Hill Mackie L Nyree Applegate 06/24/2013 4:46 PM 

## 2013-06-24 NOTE — Progress Notes (Signed)
Adult Psychoeducational Group Note  Date:  06/24/2013 Time:  10:35 PM  Group Topic/Focus:  Wrap-Up Group:   The focus of this group is to help patients review their daily goal of treatment and discuss progress on daily workbooks.  Participation Level:  Active  Participation Quality:  Appropriate  Affect:  Appropriate  Cognitive:  Appropriate  Insight: Appropriate  Engagement in Group:  Engaged  Modes of Intervention:  Discussion  Additional Comments:  Pt was present for wrap up group. He said that today he got to attend a wrap up group. He said that although it is not a service he says that he needs, it was interesting to see how it works and to learn the 12 steps. He was able to identify that they can be used to address other problems than addiction. He says that he is happy that he got to see his family today and that he gets to go home tomorrow. He reports feeling prepared to go home and says that he is comfortable with being discharged.   Adanya Sosinski A Chetan Mehring 06/24/2013, 10:35 PM

## 2013-06-24 NOTE — [facility] Suicide Risk Assessment (Signed)
[facility] INPATIENT:  Family/Significant Other Suicide Prevention Education  Suicide Prevention Education:  Education Completed; Marshell LevanCecila Deluna - girlfriend [phone]),  (name of family member/significant other) has been identified by the patient as the family member/significant other with whom the patient will be residing, and identified as the person(s) who will aid the patient in the event of a mental health crisis (suicidal ideations/suicide attempt).  With written consent from the patient, the family member/significant other has been provided the following suicide prevention education, prior to the and/or following the discharge of the patient.  The suicide prevention education provided includes the following:  Suicide risk factors  Suicide prevention and interventions  National Suicide Hotline telephone number  [facility] assessment telephone number  [facility] Emergency Assistance 911  [facility] and/or Residential Mobile Crisis Unit telephone number  Request made of family/significant other to:  Remove weapons (e.g., guns, rifles, knives), all items previously/currently identified as safety concern.    Remove drugs/medications (over-the-counter, prescriptions, illicit drugs), all items previously/currently identified as a safety concern.  The family member/significant other verbalizes understanding of the suicide prevention education information provided.  The family member/significant other agrees to remove the items of safety concern listed above.  Neeley Sedivy N Horton 06/24/2013, 12:16 PM

## 2013-06-24 NOTE — Progress Notes (Signed)
Patient ID: Eric Osborne, male   DOB: Aug 08, 1990, 23 y.o.   MRN: [id number] D- Patient reports he slept well and his appetite is good.  His energy level is normal and his ability to pay attention is good.  He denies feeling depressed or hopeless or having any anxiety.  A- supported patient.  r- Patient says he feel he has learned how important it is to be open with his family and "open up more" about his feelings.  He is hoping to be discharged tomorrow.

## 2013-06-24 NOTE — [facility] Group Notes (Signed)
[facility] LCSW Group Therapy  06/24/2013   1:15 PM   Type of Therapy:  Group Therapy  Participation Level:  Active  Participation Quality:  Attentive, Sharing and Supportive  Affect:  Calm  Cognitive:  Alert and Oriented  Insight:  Developing/Improving and Engaged  Engagement in Therapy:  Developing/Improving and Engaged  Modes of Intervention:  Activity, Clarification, Confrontation, Discussion, Education, Exploration, Limit-setting, Orientation, Problem-solving, Rapport Building, Reality Testing, Socialization and Support  Summary of Progress/Problems: Patient was attentive and engaged with speaker from [employer].  Patient was attentive to speaker while they shared their story of dealing with mental health and overcoming it.  Patient expressed interest in their programs and services and received information on their agency.  Patient processed ways they can relate to the speaker.     Eric Constantino Horton, LCSW 06/24/2013  1:47 PM     

## 2013-06-24 NOTE — Progress Notes (Signed)
D: Pt denies SI/HI/AVH. Pt is pleasant and cooperative. Pt stated he's ready to get back to work.  A: Pt was offered support and encouragement. Pt was given scheduled medications. Pt was encourage to attend groups. Q 15 minute checks were done for safety.   R:Pt attends groups and interacts well with peers and staff. Pt is taking medication. Pt has no complaints at this time .Pt receptive to treatment and safety maintained on unit.

## 2013-06-24 NOTE — Progress Notes (Signed)
Patient ID: Eric Osborne, male   DOB: [birth year], 23 y.o.   MRN: [id number] Patient ID: Eric Osborne, male   DOB: [birth year], 23 y.o.   MRN: [id number] [facility]  06/24/2013 12:16 PM Eric Osborne  MRN:  [id number]  Subjective: [name] was seen and chart reviewed. Patient has been admitted for depression and tylenol overdose which is intentionanal harm but changed his mind and than seeking for help. He has been complaint with his medications and reportedly no adverse effects. He has been stressed about paying child support and financial difficulties. 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 He has been actively participating in all of his groups and has been learning coping skills and appreciate the help he is receiving from Group 1 Automotivestaff/group leaders. He states that he slept well and his appetite is fine. He is hoping to go home tomorrow.  He has minimizes his symptoms of depression and anxiety. He has denied suicidal thoughts and contract for safety.  Diagnosis:   DSM5: Schizophrenia Disorders:  NA Obsessive-Compulsive Disorders:  NA Trauma-Stressor Disorders:  NA Substance/Addictive Disorders:  NA Depressive Disorders:  Major Depressive Disorder - Mild (296.21) Total Time spent with patient: 35 minutes  Axis I: MDD (major depressive disorder) Axis II: Deferred Axis III:  Past Medical History  Diagnosis Date  . Hypertension    Axis IV: other psychosocial or environmental problems Axis V: 51-60 moderate symptoms  ADL's:  Intact  Sleep: Good  Appetite:  Good  Suicidal Ideation:  Plan:  Denies Intent:  Denies Means:  Denies Homicidal Ideation:  Plan:  Denies Intent:  denies Means:  Denies AEB (as evidenced by):  Psychiatric Specialty Exam: Physical Exam  Psychiatric: His speech is normal and behavior is normal. Judgment and thought content normal. His mood appears not anxious. His affect is not angry. Cognition and  memory are normal. He exhibits a depressed mood (but improving).    Review of Systems  Constitutional: Negative.   HENT: Negative.   Eyes: Negative.   Respiratory: Negative.   Cardiovascular: Negative.   Gastrointestinal: Negative.   Genitourinary: Negative.   Musculoskeletal: Negative.   Skin: Negative.   Neurological: Negative.   Endo/Heme/Allergies: Negative.   Psychiatric/Behavioral: Positive for depression (currently being stabilizded with medication). Negative for suicidal ideas, hallucinations, memory loss and substance abuse. The patient is not nervous/anxious and does not have insomnia.     Blood pressure 125/79, pulse 71, temperature 97.7 F (36.5 C), temperature source Oral, resp. rate 16, height 5' 10.5" (1.791 m), weight 95.709 kg (211 lb).Body mass index is 29.84 kg/(m^2).  General Appearance: Casual and Fairly Groomed  Patent attorneyye Contact::  Good  Speech:  Clear and Coherent  Volume:  Normal  Mood:  "Improving"  Affect:  Congruent  Thought Process:  Coherent, Goal Directed and Intact  Orientation:  Full (Time, Place, and Person)  Thought Content:  Denies any psychotic symptoms  Suicidal Thoughts:  No  Homicidal Thoughts:  No  Memory:  Immediate;   Good Recent;   Good Remote;   Good  Judgement:  Fair  Insight:  Fair  Psychomotor Activity:  Normal  Concentration:  Good  Recall:  Good  Fund of Knowledge:Fair  Language: Fair  Akathisia:  No  Handed:  Right  AIMS (if indicated):     Assets:  Communication Skills Desire for Improvement Physical Health  Sleep:  Number of Hours: 6.75   Musculoskeletal: Strength & Muscle Tone: within  normal limits Gait & Station: normal Patient leans: N/A  Current Medications: Current Facility-Administered Medications  Medication Dose Route Frequency Provider Last Rate Last Dose  . alum & mag hydroxide-simeth (MAALOX/MYLANTA) 200-200-20 MG/5ML suspension 30 mL  30 mL Oral Q4H PRN Nelly RoutArchana Kumar, MD      . ibuprofen (ADVIL,MOTRIN)  tablet 600 mg  600 mg Oral Q6H PRN Nelly RoutArchana Kumar, MD   600 mg at 06/22/13 2133  . LORazepam (ATIVAN) tablet 1 mg  1 mg Oral Q8H PRN Nelly RoutArchana Kumar, MD      . magnesium hydroxide (MILK OF MAGNESIA) suspension 30 mL  30 mL Oral Daily PRN Nelly RoutArchana Kumar, MD      . nicotine (NICODERM CQ - dosed in mg/24 hours) patch 21 mg  21 mg Transdermal Q0600 Nelly RoutArchana Kumar, MD      . potassium chloride (K-DUR,KLOR-CON) CR tablet 10 mEq  10 mEq Oral BID Verne SpurrNeil Mashburn, PA-C   10 mEq at 06/23/13 1045  . sertraline (ZOLOFT) tablet 50 mg  50 mg Oral Daily Nelly RoutArchana Kumar, MD   50 mg at 06/23/13 0751  . traZODone (DESYREL) tablet 50 mg  50 mg Oral QHS PRN Fransisca KaufmannLaura Davis, NP        Lab Results: No results found for this or any previous visit (from the past 48 hour(s)).  Physical Findings: AIMS: Facial and Oral Movements Muscles of Facial Expression: None, normal Lips and Perioral Area: None, normal Jaw: None, normal Tongue: None, normal,Extremity Movements Upper (arms, wrists, hands, fingers): None, normal Lower (legs, knees, ankles, toes): None, normal, Trunk Movements Neck, shoulders, hips: None, normal, Overall Severity Severity of abnormal movements (highest score from questions above): None, normal Incapacitation due to abnormal movements: None, normal Patient's awareness of abnormal movements (rate only patient's report): No Awareness, Dental Status Current problems with teeth and/or dentures?: No Does patient usually wear dentures?: No  CIWA:    COWS:     Treatment Plan Summary: Daily contact with patient to assess and evaluate symptoms and progress in treatment Medication management  Plan:  1. Continue crisis management and stabilization.  2. Medication management:  Continue Sertraline 50 mg daily for depression/anxiety  Continue Trazodone 50 mg Q bedtime for sleep. 3. Encouraged to continue the antidepressant for at least 3 months after discharge and have his outpatient psychiatrist evaluate his  progress and adjust dose accordingly. 4. Encouraged patient to attend groups and participate in group counseling sessions and activities.  5. Continue current treatment plan.  6. Address health issues: Vitals reviewed and stable.  7. Disposition plans are in progress and may discharge tomorrow when he can show continued clinical improvement and contract for safety  Medical Decision Making Problem Points:  Review of last therapy session (1) and Review of psycho-social stressors (1) Data Points:  Review of medication regiment & side effects (2) Review of new medications or change in dosage (2)  I certify that inpatient services furnished can reasonably be expected to improve the patient's condition.     Randal BubaJanardhaha R Nivia Gervase 06/24/2013 2:49 PM

## 2013-06-24 NOTE — Progress Notes (Signed)
D: Pt is appropriate in affect and pleasant in mood. Pt is denying any psychosocial symptoms at this time. He reports that he is anxiously waiting to go home. He is negative for any SI/HI/AVH. Pt is actively participating within the milieu. A: Writer administered Ibuprofen as prescribed for this pt's tooth pain. Continued support and availability as needed was extended to this pt. Staff continue to monitor pt with q6115min checks.  R: No adverse drug reactions noted. Pt receptive to treatment. Pt remains safe at this time.

## 2013-06-25 MED ORDER — SERTRALINE HCL 50 MG PO TABS: 50.0000 mg | ORAL_TABLET | Freq: Every day | ORAL | Status: DC

## 2013-06-25 MED ORDER — LORAZEPAM 1 MG PO TABS
1.0000 mg | ORAL_TABLET | Freq: Three times a day (TID) | ORAL | Status: DC | PRN
Start: 2013-06-25 — End: 2013-06-25

## 2013-06-25 MED ORDER — LORAZEPAM 1 MG PO TABS: 1.0000 mg | ORAL_TABLET | Freq: Three times a day (TID) | ORAL | Status: DC | PRN

## 2013-06-25 MED ORDER — TRAZODONE HCL 50 MG PO TABS: 50.0000 mg | ORAL_TABLET | Freq: Every evening | ORAL | Status: DC | PRN

## 2013-06-25 MED ORDER — TRAZODONE HCL 50 MG PO TABS
50.0000 mg | ORAL_TABLET | Freq: Every evening | ORAL | Status: DC | PRN
Start: 2013-06-25 — End: 2013-06-25

## 2013-06-25 NOTE — [facility] Suicide Risk Assessment (Signed)
   Demographic Factors:  Male and Adolescent or young adult  Total Time spent with patient: 30 minutes  Psychiatric Specialty Exam: Physical Exam  ROS  Blood pressure 132/90, pulse 76, temperature 97.5 F (36.4 C), temperature source Oral, resp. rate 16, height 5' 10.5" (1.791 m), weight 95.709 kg (211 lb).Body mass index is 29.84 kg/(m^2).  General Appearance: Casual  Eye Contact::  Good  Speech:  Clear and Coherent  Volume:  Normal  Mood:  Euthymic  Affect:  Appropriate and Congruent  Thought Process:  Coherent and Goal Directed  Orientation:  Full (Time, Place, and Person)  Thought Content:  WDL  Suicidal Thoughts:  No  Homicidal Thoughts:  No  Memory:  Immediate;   Good  Judgement:  Good  Insight:  Good  Psychomotor Activity:  Normal  Concentration:  Good  Recall:  Good  Fund of Knowledge:Good  Language: Good  Akathisia:  NA  Handed:  Right  AIMS (if indicated):     Assets:  Communication Skills Desire for Improvement Financial Resources/Insurance Housing Intimacy Leisure Time Physical Health Resilience Social Support Talents/Skills Transportation Vocational/Educational  Sleep:  Number of Hours: 6.75    Musculoskeletal: Strength & Muscle Tone: within normal limits Gait & Station: normal Patient leans: N/A   Mental Status Per Nursing Assessment::   On Admission:     Current Mental Status by Physician: NA  Loss Factors: NA  Historical Factors: NA  Risk Reduction Factors:   Responsible for children under 25 years of age, Sense of responsibility to family, Religious beliefs about death, Employed, Living with another person, especially a relative, Positive social support, Positive therapeutic relationship and Positive coping skills or problem solving skills  Continued Clinical Symptoms:  Depression:   Recent sense of peace/wellbeing  Cognitive Features That Contribute To Risk:  Polarized thinking    Suicide Risk:  Minimal: No identifiable  suicidal ideation.  Patients presenting with no risk factors but with morbid ruminations; may be classified as minimal risk based on the severity of the depressive symptoms  Discharge Diagnoses:   AXIS I:  Major Depression, single episode AXIS II:  Deferred AXIS III:   Past Medical History  Diagnosis Date  . Hypertension    AXIS IV:  other psychosocial or environmental problems, problems related to social environment and problems with primary support group AXIS V:  61-70 mild symptoms  Plan Of Care/Follow-up recommendations:  Activity:  As tolerated Diet:  Regular  Is patient on multiple antipsychotic therapies at discharge:  No   Has Patient had three or more failed trials of antipsychotic monotherapy by history:  No  Recommended Plan for Multiple Antipsychotic Therapies: NA    Eric Osborne 06/25/2013, 1:09 PM

## 2013-06-25 NOTE — Progress Notes (Signed)
[facility] Discharge Plan :  Will you be returning to the same living situation after discharge: Yes,  returning home with girlfriend as support At discharge, do you have transportation home?:Yes,  girlfriend will pick pt up today Do you have the ability to pay for your medications:Yes,  provided pt with prescriptions and pt verbalizes ability to afford meds   Release of information consent forms completed and in the chart;  Patient's signature needed at discharge.  Patient to Follow up at: Follow-up Information   [facility] On 08/07/2013. (Appointment scheduled at 12:30 pm on this date with Dr. Alena BillsAgrawal for medication management.  Bring completed paperwork to this appointment!)    Contact information:   [address] [place], [place] [address] Phone: [phone]      Patient denies SI/HI:   Yes,  denies SI/HI    Safety Planning and Suicide Prevention discussed:  Yes,  discussed with pt and pt's girlfriend.  See suicide prevention education note.   Kismet Facemire N Horton 06/25/2013, 7:56 AM

## 2013-06-25 NOTE — Tx Team (Addendum)
Interdisciplinary Treatment Plan Update (Adult)  Date: 06/25/2013  Time Reviewed:  9:45 AM  Progress in Treatment: Attending groups: Yes Participating in groups:  Yes Taking medication as prescribed:  Yes Tolerating medication:  Yes Family/Significant othe contact made: Yes, with pt's girlfriend Patient understands diagnosis:  Yes Discussing patient identified problems/goals with staff:  Yes Medical problems stabilized or resolved:  Yes Denies suicidal/homicidal ideation: Yes Issues/concerns per patient self-inventory:  Yes Other:  New problem(s) identified: N/A  Discharge Plan or Barriers: Pt [facility] for medication management.  Pt declined a referral for therapy at this time.    Reason for Continuation of Hospitalization: Stable to d/c today  Comments: N/A  Estimated length of stay: D/C today  For review of initial/current patient goals, please see plan of care.  Attendees: Patient:  Eric Osborne 06/25/2013 9:58 AM   Family:     Physician:  Dr. Javier GlazierJohnalagadda 06/25/2013 9:58 AM   Nursing:   Marzetta Boardhrista Dopson, RN 06/25/2013 9:58 AM   Clinical Social Worker:  Reyes Ivanhelsea Horton, LCSW 06/25/2013 9:58 AM   Other: Verne SpurrNeil Mashburn, PA 06/25/2013 9:58 AM   Other:  Sherrye PayorValerie Noch, care coordination 06/25/2013 9:58 AM   Other:  Juline PatchQuylle Hodnett, LCSW 06/25/2013 9:58 AM   Other:  Audie Boxoniecia Byrd, RN 06/25/2013 9:59 AM   Other:    Other:    Other:    Other:    Other:      Scribe for Treatment Team:   Carmina MillerHorton, Jakyiah Briones Nicole, 06/25/2013 , 9:58 AM

## 2013-06-25 NOTE — Discharge Summary (Signed)
Physician Discharge Summary Note  Patient:  Eric Osborne is an 23 y.o., male MRN:  [id number] DOB:  09-09-1990 Patient phone:  [phone] (home)  Patient address:   [address] [place] [address],  Total Time spent with patient: 30 minutes  Date of Admission:  06/20/2013 Date of Discharge: 06/25/2013  Reason for Admission:  Depression   Discharge Diagnoses: Active Problems:   MDD (major depressive disorder)   Psychiatric Specialty Exam:  Please see D/C SRA Physical Exam  ROS  Blood pressure 132/90, pulse 76, temperature 97.5 F (36.4 C), temperature source Oral, resp. rate 16, height 5' 10.5" (1.791 m), weight 95.709 kg (211 lb).Body mass index is 29.84 kg/(m^2).   Past Psychiatric History:Denies  Diagnosis:   Hospitalizations:Denies   Outpatient Care:Denies   Substance Abuse Care: Denies   Self-Mutilation:Denies   Suicidal Attempts: Denies prior   Violent Behaviors:Denies      Axis Diagnosis:  Discharge Diagnoses:  AXIS I: Major Depression, single episode  AXIS II: Deferred  AXIS III:  Past Medical History   Diagnosis  Date   .  Hypertension     AXIS IV: other psychosocial or environmental problems, problems related to social environment and problems with primary support group  AXIS V: 61-70 mild symptoms   Level of Care:  OP  Hospital Course:  Eric Osborne is a 23 year old male who [facility] after calling EMS after a suicide attempt via overdose on tylenol. He noted financial stressors, withdrawing from family and isolating from others. He then minimized his symptoms and showed poor insight into the severity of the situation.  Psychiatric consult recommended in patient admission for crisis management and stabilization.         Eric Osborne was admitted to the adult unit. He was evaluated and his symptoms were identified. Medication management was discussed and initiated. He was oriented to the unit and encouraged to participate in  unit programming. Medical problems were identified and treated appropriately. Home medication was restarted as needed.        The patient was evaluated each day by a clinical provider to ascertain the patient's response to treatment.  Improvement was noted by the patient's report of decreasing symptoms, improved sleep and appetite, affect, medication tolerance, behavior, and participation in unit programming.  He was asked each day to complete a self inventory noting mood, mental status, pain, new symptoms, anxiety and concerns.         He responded well to medication and being in a therapeutic and supportive environment. Positive and appropriate behavior was noted and the patient was motivated for recovery.  The patient worked closely with the treatment team and case manager to develop a discharge plan with appropriate goals. Coping skills, problem solving as well as relaxation therapies were also part of the unit programming.         By the day of discharge he was in much improved condition than upon admission.  Symptoms were reported as significantly decreased or resolved completely. The patient denied SI/HI and voiced no AVH. He was motivated to continue taking medication with a goal of continued improvement in mental health.          Eric Osborne was discharged home with a plan to follow up as noted below.   Consults:  None  Significant Diagnostic Studies:  None  Discharge Vitals:   Blood pressure 132/90, pulse 76, temperature 97.5 F (36.4 C), temperature source Oral, resp. rate 16, height  5' 10.5" (1.791 m), weight 95.709 kg (211 lb). Body mass index is 29.84 kg/(m^2). Lab Results:   No results found for this or any previous visit (from the past 72 hour(s)).  Physical Findings: AIMS: Facial and Oral Movements Muscles of Facial Expression: None, normal Lips and Perioral Area: None, normal Jaw: None, normal Tongue: None, normal,Extremity Movements Upper (arms, wrists, hands, fingers):  None, normal Lower (legs, knees, ankles, toes): None, normal, Trunk Movements Neck, shoulders, hips: None, normal, Overall Severity Severity of abnormal movements (highest score from questions above): None, normal Incapacitation due to abnormal movements: None, normal Patient's awareness of abnormal movements (rate only patient's report): No Awareness, Dental Status Current problems with teeth and/or dentures?: No Does patient usually wear dentures?: No  CIWA:    COWS:     Psychiatric Specialty Exam: See Psychiatric Specialty Exam and Suicide Risk Assessment completed by Attending Physician prior to discharge.  Discharge destination:  Home  Is patient on multiple antipsychotic therapies at discharge:  No   Has Patient had three or more failed trials of antipsychotic monotherapy by history:  No  Recommended Plan for Multiple Antipsychotic Therapies: NA  Discharge Instructions   Diet - low sodium heart healthy    Complete by:  As directed      Discharge instructions    Complete by:  As directed   Take all of your medications as directed. Be sure to keep all of your follow up appointments.  If you are unable to keep your follow up appointment, call your Doctor's office to let them know, and reschedule.  Make sure that you have enough medication to last until your appointment. Be sure to get plenty of rest. Going to bed at the same time each night will help. Try to avoid sleeping during the day.  Increase your activity as tolerated. Regular exercise will help you to sleep better and improve your mental health. Eating a heart healthy diet is recommended. Try to avoid salty or fried foods. Be sure to avoid all alcohol and illegal drugs.     Increase activity slowly    Complete by:  As directed             Medication List    STOP taking these medications       ibuprofen 800 MG tablet  Commonly known as:  ADVIL,MOTRIN      TAKE these medications     Indication   LORazepam 1 MG  tablet  Commonly known as:  ATIVAN  Take 1 tablet (1 mg total) by mouth every 8 (eight) hours as needed for anxiety.   Anxiety   sertraline 50 MG tablet  Commonly known as:  ZOLOFT  Take 1 tablet (50 mg total) by mouth daily.   Indication:  Major Depressive Disorder     traZODone 50 MG tablet  Commonly known as:  DESYREL  Take 1 tablet (50 mg total) by mouth at bedtime as needed for sleep.   Indication:  Trouble Sleeping           Follow-up Information   Follow up with [facility] On 08/07/2013. (Appointment scheduled at 12:30 pm on this date with Dr. Alena BillsAgrawal for medication management.  Bring completed paperwork to this appointment!)    Contact information:   [address] [place], [place] [address] Phone: [phone]      Follow-up recommendations:     Activities:  Resume activity as tolerated. Diet: Heart healthy low sodium diet Tests: Follow up testing  will be determined by your out patient provider. Comments:    Total Discharge Time:  Less than 30 minutes.  Signed: Rona Ravens. Mashburn RPAC 10:31 AM 06/25/2013  Patient was seen face to face for psychiatric evaluation, case discussed with treatment team and physician extender and made appropriate disposition plan. Reviewed the information documented and agree with the treatment plan.  Randal Buba Monike Bragdon 06/30/2013 12:49 PM

## 2013-06-25 NOTE — Progress Notes (Signed)
Patient ID: Eric Osborne, male   DOB: 07-05-1990, 23 y.o.   MRN: [id number]  Pt. Denies SI/HI and A/V hallucinations. Patient rates his depression and hopelessness at 1/10 for the day. Belongings returned to patient at time of discharge. Patient denies any pain or discomfort. Discharge instructions and medications were reviewed with patient. Patient verbalized understanding of both medications and discharge instructions. Q15 minute safety checks maintained until discharge. No distress noted upon discharge.

## 2013-06-27 NOTE — Progress Notes (Signed)
Patient Discharge Instructions:  Next Level Care Provider Has Access to the EMR, 06/27/13 Records provided to [facility] via [facility]/Epic access.  Jerelene Redden, 06/27/2013, 3:46 PM

## 2013-08-07 ENCOUNTER — Encounter (HOSPITAL_COMMUNITY): Payer: Self-pay | Admitting: Psychiatry

## 2013-08-07 ENCOUNTER — Ambulatory Visit (INDEPENDENT_AMBULATORY_CARE_PROVIDER_SITE_OTHER): Payer: BC Managed Care – PPO | Admitting: Psychiatry

## 2013-08-07 ENCOUNTER — Encounter (INDEPENDENT_AMBULATORY_CARE_PROVIDER_SITE_OTHER): Payer: Self-pay

## 2013-08-07 VITALS — BP 122/67 | HR 62 | Ht 71.0 in | Wt 229.4 lb

## 2013-08-07 DIAGNOSIS — F320 Major depressive disorder, single episode, mild: Secondary | ICD-10-CM

## 2013-08-07 DIAGNOSIS — F329 Major depressive disorder, single episode, unspecified: Secondary | ICD-10-CM

## 2013-08-07 MED ORDER — SERTRALINE HCL 50 MG PO TABS: 75.0000 mg | ORAL_TABLET | Freq: Every day | ORAL | Status: DC

## 2013-08-07 NOTE — Progress Notes (Signed)
Psychiatric Assessment Adult  Patient Identification:  Eric Osborne Date of Evaluation:  08/07/2013 Chief Complaint: hospital f/up History of Chief Complaint:   Chief Complaint  Patient presents with  . Depression    HPI Comments: States overall things are better. He feels like he  Has improved and is not anywhere near to the emotional state he was in at the time of his attempt. [facility]5/[facility]/15 after a suicide attempt by Tylenol OD while feeling depressed and overwhelmed. Inpt treatment was helpful.  Girlfriend told him he is doing well now but he is irritable. Pt states he gets frustrated easily and it makes him irritable. Once a week he feels down when he is has a problem or is stressed financially. He has been having nightmares weekly about his suicide attempt. He recently came back from vacation and felt better. Denies anhedonia and low motivation. Denies isolation. He is spending tmie with his parents and girlfriend. He admits to feeling worthless when he is needs help financially. Denies hopelessness. Sleeping 7 hrs/night and he is not taking Trazodone. Appetite and energy are good. Concentration is good and he is doing good at work. Denies SI/HI. Denies access to guns. Pt is taking Zoloft as prescribed but notes he has diarrhea in the afternoon at least 3x/week.  Review of Systems  Constitutional: Negative.   HENT: Negative.   Eyes: Negative.   Respiratory: Negative.   Cardiovascular: Negative.   Gastrointestinal: Positive for diarrhea.  Musculoskeletal: Negative.   Skin: Negative.   Neurological: Negative.   Psychiatric/Behavioral: Negative.    Physical Exam  Psychiatric: He has a normal mood and affect. His speech is normal and behavior is normal. Judgment and thought content normal. Cognition and memory are normal.    Depressive Symptoms: Denies- see HPI  (Hypo) Manic Symptoms:   Elevated Mood:  No Irritable Mood:  No Grandiosity:   No Distractibility:  No Labiality of Mood:  No Delusions:  No Hallucinations:  No Impulsivity:  No Sexually Inappropriate Behavior:  No Financial Extravagance:  No Flight of Ideas:  No  Anxiety Symptoms: Excessive Worry:  denies- states anxiety is well controlled. He has not taken Ativan since d/c.  Panic Symptoms:  No Agoraphobia:  No Obsessive Compulsive: No  Symptoms: None, Specific Phobias:  No Social Anxiety:  No  Psychotic Symptoms:  Hallucinations: No None Delusions:  No Paranoia:  No   Ideas of Reference:  No  PTSD Symptoms: Ever had a traumatic exposure:  No Had a traumatic exposure in the last month:  No Re-experiencing: No None Hypervigilance:  No Hyperarousal: No None Avoidance: No None  Traumatic Brain Injury: No   Past Psychiatric History: Diagnosis: MDD  Hospitalizations: [facility] May 2015 after SA  Outpatient Care: denies  Substance Abuse Care: denies  Self-Mutilation: denies  Suicidal Attempts: suicide attempt in May 2015 by OD. States it was impulsive and he didn't tell anyone. States he regrets it.  Violent Behaviors: denies   Past Medical History:   Past Medical History  Diagnosis Date  . Hypertension    History of Loss of Consciousness:  No Seizure History:  No Cardiac History:  No Allergies:  No Known Allergies Current Medications:  Current Outpatient Prescriptions  Medication Sig Dispense Refill  . sertraline (ZOLOFT) 50 MG tablet Take 1 tablet (50 mg total) by mouth daily.  45 tablet  0  . LORazepam (ATIVAN) 1 MG tablet Take 1 tablet (1 mg total) by mouth every 8 (eight) hours  as needed.  45 tablet  0  . traZODone (DESYREL) 50 MG tablet Take 1 tablet (50 mg total) by mouth at bedtime as needed for sleep.  45 tablet  0   No current facility-administered medications for this visit.    Previous Psychotropic Medications:  Medication Dose   denies                       Substance Abuse History in the last 12 months: Substance Age  of 1st Use Last Use Amount Specific Type  Nicotine  16  23 yr old  1 pack per week cigs  Alcohol  16  last weekend Socially a few times a month 3-4 drinks at the time beer  Cannabis  denies        Opiates  denies        Cocaine  denies        Methamphetamines  denies        LSD  denies        Ecstasy  denies         Benzodiazepines  denies        Caffeine   today 1 can a day soda  Inhalants  denies        Others: denies                         Medical Consequences of Substance Abuse: denies Legal Consequences of Substance Abuse: denies Family Consequences of Substance Abuse: denies  Blackouts:  No DT's:  No Withdrawal Symptoms:  No None  Social History: Current Place [place] Residence: [place] living with g/f Place of Birth: [place] Family Members: raised by mom and dad, 2 bro and 2 sisters Marital Status:  Single Children: 1  Sons: 0  Daughters: 804 yo lives with her mom Relationships: current g/f 3 yrs, good relationship Education:  HS Print production plannerGraduate Educational Problems/Performance: good Religious Beliefs/Practices: denies History of Abuse: none Occupational Experiences: Merchandiser, retailsupervisor at [employer], 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 Legal History: denies Hobbies/Interests: bowling, baseball  Family History:   Family History  Problem Relation Age of Onset  . Depression Mother     Mental Status Examination/Evaluation:  Objective: Appearance: fairly groomed, appears to be stated age  Attitude: Calm and cooperative  Eye Contact: Fair   Speech and Language: Clear and Coherent, spontaneous, normal rate  Volume: Normal   Mood: euthymic  Affect: Full Range   Thought Process: Coherent   Attention/Concentration: WNL  Orientation: Full (Time, Place, and Person)   Thought Content: WDL  Suicidal Thoughts: No  Homicidal Thoughts: No   Judgement: Fair   General fund of knowledge: average  Insight: Present   Psychomotor Activity: Normal   Akathisia: No    Handed: Right   AIMS (if indicated): n/a   Assets:  Communication Skills Desire for Improvement Housing Intimacy Leisure Time Physical Health Resilience Social Support Talents/Skills Transportation Vocational/Educational    Laboratory/X-Ray Psychological Evaluation(s)   reviewed labs 06/19/2013: K+ mildly decreased, BUN elevated. EKG 06/22/2013 NSR  none available to review   Assessment:  23yo HM recently treated inpt for impulsive SA by OD. Pt has been compliant with meds and reports significant improvement in his depression symptoms.  AXIS I MDD  AXIS II Deferred  AXIS III Past Medical History  Diagnosis Date  . Hypertension      AXIS IV other psychosocial or environmental problems and limited coping skills  AXIS V  61-70 mild symptoms   Treatment Plan/Recommendations:  Plan of Care:  Continue medication management, risks/benefits and SE of the medication discussed. Pt verbalized understanding and verbal consent obtained for treatment.  Affirm with the patient that the medications are taken as ordered. Patient expressed understanding of how their medications were to be used.   Confidentiality and exclusions reviewed with pt who verbalized understanding.   Laboratory:  none at this time  Psychotherapy: Therapy: brief supportive therapy provided. Discussed psychosocial stressors in detail.     Medications: Increase Zoloft to 75mg  po qD for mood. Pt will monitor food intake and look for association with diarrhea. If it doesn't improve may consider switching meds.  D/c Trazodone and Ativan  Routine PRN Medications:  No  Consultations: none at this time  Safety Concerns:  Pt denies SI and is at an acute low risk for suicide.Patient told to call clinic if any problems occur. Patient advised to go to ER  if she should develop SI/HI, side effects, or if symptoms worsen. Has crisis numbers to call if needed. Pt verbalized understanding.   Other:  F/up in 3 months or sooner if  needed     Oletta Darter, MD 7/2/20151:06 PM

## 2013-08-19 ENCOUNTER — Telehealth (HOSPITAL_COMMUNITY): Payer: Self-pay | Admitting: *Deleted

## 2013-08-19 DIAGNOSIS — F320 Major depressive disorder, single episode, mild: Secondary | ICD-10-CM

## 2013-08-19 MED ORDER — SERTRALINE HCL 50 MG PO TABS: 50.0000 mg | ORAL_TABLET | Freq: Every day | ORAL | Status: DC

## 2013-08-19 NOTE — Telephone Encounter (Signed)
Patient called--Dose on Zoloft increased to 75 mg at appt.After change, head felt bad,dizzy, & foggy.Went back to 50 mg on Saturday.Tolerating okay-no side effects.Should he stay on the 50 mg or retry the 75 mg?

## 2013-08-19 NOTE — Addendum Note (Signed)
Addended by: Oletta DarterAGARWAL, Yancarlos Berthold on: 08/19/2013 03:45 PM   Modules accepted: Orders

## 2013-08-19 NOTE — Telephone Encounter (Signed)
Pt reports he was having dizziness and headaches after increasing Zoloft to 75mg . He has decreased dose to 50mg  since Saturday and symptoms have resolved. Pt would like to stay on this dose.  A/P: MDD 1. Decrease Zoloft to 50mg  po qD for depression

## 2013-10-07 ENCOUNTER — Telehealth (HOSPITAL_COMMUNITY): Payer: Self-pay | Admitting: *Deleted

## 2013-10-07 DIAGNOSIS — F331 Major depressive disorder, recurrent, moderate: Secondary | ICD-10-CM

## 2013-10-07 MED ORDER — CITALOPRAM HYDROBROMIDE 20 MG PO TABS: 20.0000 mg | ORAL_TABLET | Freq: Every day | ORAL | Status: DC

## 2013-10-07 NOTE — Addendum Note (Signed)
Addended by: Oletta Darter on: 10/07/2013 04:20 PM   Modules accepted: Orders, Medications

## 2013-10-07 NOTE — Telephone Encounter (Signed)
Pt states he has been having some problems lately. He has been feeling really bad. States the medication is not doing enough for him and he would like therapy. He has been feeling worthless and is irritable. Pt has been arguing with his g/f a lot.   A/P: MDD 1. discontinue Zoloft  qD- pt could not tolerate higher dose due to SE 2. Refer to therapist  3. Start trial of Celexa  po qD for depression

## 2013-10-07 NOTE — Telephone Encounter (Signed)
Pt called stating he needs refill of Zoloft because he spilled some down the drain. On review of chart, last written for  qday on 08/23/13. Note dated 08/07/13 states to increase Zoloft to  qday. Will need clarification from Dr before refill can be given. Pt also wanted Dr to call him states he was hoping to get a sooner appointment as discussed during last visit because he is having some issues he would like to discuss with her.

## 2013-10-07 NOTE — Telephone Encounter (Addendum)
Called and left VM for call back.

## 2013-11-11 ENCOUNTER — Ambulatory Visit (HOSPITAL_COMMUNITY): Payer: Self-pay | Admitting: Psychiatry

## 2013-12-03 ENCOUNTER — Other Ambulatory Visit: Payer: Self-pay | Admitting: Family Medicine

## 2013-12-03 DIAGNOSIS — M792 Neuralgia and neuritis, unspecified: Secondary | ICD-10-CM

## 2013-12-11 ENCOUNTER — Ambulatory Visit (INDEPENDENT_AMBULATORY_CARE_PROVIDER_SITE_OTHER): Payer: BC Managed Care – PPO | Admitting: Psychiatry

## 2013-12-11 ENCOUNTER — Encounter (HOSPITAL_COMMUNITY): Payer: Self-pay | Admitting: Psychiatry

## 2013-12-11 VITALS — BP 123/74 | HR 61 | Ht 71.0 in | Wt 246.2 lb

## 2013-12-11 DIAGNOSIS — F331 Major depressive disorder, recurrent, moderate: Secondary | ICD-10-CM

## 2013-12-11 DIAGNOSIS — G4700 Insomnia, unspecified: Secondary | ICD-10-CM

## 2013-12-11 MED ORDER — HYDROXYZINE PAMOATE 25 MG PO CAPS: ORAL_CAPSULE | ORAL | Status: DC

## 2013-12-11 MED ORDER — CITALOPRAM HYDROBROMIDE 20 MG PO TABS: 20.0000 mg | ORAL_TABLET | Freq: Every day | ORAL | Status: DC

## 2013-12-11 NOTE — Progress Notes (Signed)
[facility] [id number] Progress Note  Eric Osborne [id number] 23 y.o.  12/11/2013 1:42 PM  Chief Complaint: doing good  History of Present Illness: Overall he is doing well. States depression is now situational and more related to arguments with people. Denies irritability. Denies isolation, crying spells, hopelessness, worthlessness and anhedonia. Sleep is poor due to his work schedule. He works from from [employer] as a Merchandiser, retailsupervisor. He tries to lie down at 8pm. Energy is fair. Appetite is fair but he has gained 20 lbs in 4 months. States he is eating the same.   He stopped taking Zoloft b/c it was making him dizzy. He has been compliant with Celexa and denies SE.   Suicidal Ideation: No Plan Formed: No Patient has means to carry out plan: No  Homicidal Ideation: No Plan Formed: No Patient has means to carry out plan: No  Review of Systems: Psychiatric: Agitation: No Hallucination: No Depressed Mood: No Insomnia: No Hypersomnia: Yes Altered Concentration: No Feels Worthless: No Grandiose Ideas: No Belief In Special Powers: No New/Increased Substance Abuse: No Compulsions: No  Neurologic: Headache: No Seizure: No Paresthesias: No   Review of Systems  Constitutional: Negative for fever, chills and malaise/fatigue.  HENT: Negative for congestion and sore throat.   Eyes: Negative for blurred vision, double vision and redness.  Respiratory: Negative for cough, sputum production and shortness of breath.   Cardiovascular: Negative for chest pain, palpitations and leg swelling.  Gastrointestinal: Negative for heartburn, nausea, vomiting and abdominal pain.  Musculoskeletal: Negative for back pain, joint pain and neck pain.  Skin: Negative for itching and rash.  Neurological: Positive for tingling. Negative for dizziness, focal weakness, seizures and headaches.  Psychiatric/Behavioral: Negative for depression, suicidal ideas, hallucinations and substance abuse. The  patient is not nervous/anxious and does not have insomnia.       Past Medical Family, Social History: [place]/f. Raised by parents and has 2 bro and 2 sisters. Pt is single and has 804 yo daughter. He is a HS grad. 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  reports that he quit smoking about 4 years ago. He has never used smokeless tobacco. He reports that he does not drink alcohol or use illicit drugs.  Family History  Problem Relation Age of Onset  . Depression Mother   . Suicidality Neg Hx    Past Medical History  Diagnosis Date  . Hypertension   . Depression    Outpatient Encounter Prescriptions as of 12/11/2013  Medication Sig  . citalopram (CELEXA) 20 MG tablet Take 1 tablet (20 mg total) by mouth daily.    Past Psychiatric History/Hospitalization(s): Anxiety: No Bipolar Disorder: No Depression: Yes Mania: No Psychosis: No Schizophrenia: No Personality Disorder: No Hospitalization for psychiatric illness: Yes History of Electroconvulsive Shock Therapy: No Prior Suicide Attempts: Yes  Physical Exam: Constitutional:  BP 123/74 mmHg  Pulse 61  Ht 5\' 11"  (1.803 m)  Wt 246 lb 3.2 oz (111.676 kg)  BMI 34.35 kg/m2  General Appearance: alert, oriented, no acute distress and well nourished  Musculoskeletal: Strength & Muscle Tone: within normal limits Gait & Station: normal Patient leans: N/A  Mental Status Examination/Evaluation: Objective: Attitude: Calm and cooperative  Appearance: Fairly Groomed, appears to be stated age  Eye Contact::  Good  Speech:  Clear and Coherent and Normal Rate  Volume:  Normal  Mood:  euythmic  Affect:  Full Range  Thought Process:  Goal Directed, Linear and Logical  Orientation:  Full (Time, Place, and Person)  Thought Content:  Negative  Suicidal Thoughts:  No  Homicidal Thoughts:  No  Judgement:  Fair  Insight:  Fair  Concentration: good  Memory:  Immediate-good Recent-good Remote-good  Recall: fair  Language: fair  Gait and Station: normal  [employer] of Knowledge: average  Psychomotor Activity:  Normal  Akathisia:  No  Handed:  Right  AIMS (if indicated):  N/a  Assets:  Manufacturing systems engineerCommunication Skills Desire for Improvement Financial Resources/Insurance Housing Intimacy Leisure Time Physical Health Resilience Social Support Armed forces technical officerTalents/Skills Transportation Vocational/Educational     Medical Decision Making (Choose Three): Established Problem, Stable/Improving (1), Review of Psycho-Social Stressors (1), Review of Medication Regimen & Side Effects (2) and Review of New Medication or Change in Dosage (2)  Assessment: AXIS I MDD- recurrent episode, moderate  AXIS II Deferred  AXIS III Past Medical History  Diagnosis Date  . Hypertension      AXIS IV other psychosocial or environmental problems and limited coping skills  AXIS V 61-70 mild symptoms   Treatment Plan/Recommendations:  Plan of Care:  Medication management with supportive therapy. Risks/benefits and SE of the medication discussed. Pt verbalized understanding and verbal consent obtained for treatment.  Affirm with the patient that the medications are taken as ordered. Patient expressed understanding of how their medications were to be used.  - improvement of symptoms     Laboratory: none at this time  Psychotherapy: Therapy: brief supportive therapy provided. Discussed psychosocial stressors in detail.    Medications: Celexa 20mg  po qD for mood.   Start trial of Vistaril 25-50mg  po qHS prn insomnia  Routine PRN Medications:yes  Consultations: none at this time  Safety Concerns: Pt denies SI and is at an acute low risk for suicide.Patient told to call clinic if any problems occur. Patient advised to go to ER if she should develop SI/HI, side effects, or if symptoms worsen. Has crisis numbers to call if needed. Pt verbalized  understanding.   Other: F/up in 3 months or sooner if needed    Oletta DarterAGARWAL, [place] Presley, MD 12/11/2013

## 2016-05-04 ENCOUNTER — Emergency Department (HOSPITAL_COMMUNITY): Payer: No Typology Code available for payment source

## 2016-05-04 ENCOUNTER — Encounter (HOSPITAL_COMMUNITY): Payer: Self-pay

## 2016-05-04 ENCOUNTER — Emergency Department (HOSPITAL_COMMUNITY)
Admission: EM | Admit: 2016-05-04 | Discharge: 2016-05-05 | Disposition: A | Discharge status: Home / Self Care | Payer: No Typology Code available for payment source | Attending: Emergency Medicine | Admitting: Emergency Medicine

## 2016-05-04 DIAGNOSIS — R51 Headache: Secondary | ICD-10-CM | POA: Insufficient documentation

## 2016-05-04 DIAGNOSIS — Y999 Unspecified external cause status: Secondary | ICD-10-CM | POA: Insufficient documentation

## 2016-05-04 DIAGNOSIS — S139XXA Sprain of joints and ligaments of unspecified parts of neck, initial encounter: Secondary | ICD-10-CM | POA: Insufficient documentation

## 2016-05-04 DIAGNOSIS — Y939 Activity, unspecified: Secondary | ICD-10-CM | POA: Diagnosis not present

## 2016-05-04 DIAGNOSIS — I10 Essential (primary) hypertension: Secondary | ICD-10-CM | POA: Insufficient documentation

## 2016-05-04 DIAGNOSIS — S199XXA Unspecified injury of neck, initial encounter: Secondary | ICD-10-CM | POA: Diagnosis present

## 2016-05-04 DIAGNOSIS — S80212A Abrasion, left knee, initial encounter: Secondary | ICD-10-CM | POA: Diagnosis not present

## 2016-05-04 DIAGNOSIS — T07XXXA Unspecified multiple injuries, initial encounter: Secondary | ICD-10-CM

## 2016-05-04 DIAGNOSIS — Z87891 Personal history of nicotine dependence: Secondary | ICD-10-CM | POA: Insufficient documentation

## 2016-05-04 DIAGNOSIS — Y92410 Unspecified street and highway as the place of occurrence of the external cause: Secondary | ICD-10-CM | POA: Insufficient documentation

## 2016-05-04 MED ORDER — HYDROMORPHONE HCL 1 MG/ML IJ SOLN
1.0000 mg | Freq: Once | INTRAMUSCULAR | Status: AC
  Administered 2016-05-05: 1 mg via INTRAMUSCULAR
  Filled 2016-05-04: qty 1
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
## 2016-05-05 MED ORDER — IBUPROFEN 200 MG PO TABS
600.0000 mg | ORAL_TABLET | Freq: Once | ORAL | Status: AC
  Administered 2016-05-05: 600 mg via ORAL
  Filled 2016-05-05: qty 3

## 2016-05-05 MED ORDER — IBUPROFEN 600 MG PO TABS: 600.0000 mg | ORAL_TABLET | Freq: Four times a day (QID) | ORAL | 0 refills | Status: DC | PRN
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
## 2016-07-11 ENCOUNTER — Ambulatory Visit (INDEPENDENT_AMBULATORY_CARE_PROVIDER_SITE_OTHER): Payer: Self-pay | Admitting: Physician Assistant

## 2018-02-21 ENCOUNTER — Ambulatory Visit (INDEPENDENT_AMBULATORY_CARE_PROVIDER_SITE_OTHER): Payer: Self-pay

## 2018-02-21 ENCOUNTER — Encounter (HOSPITAL_COMMUNITY): Payer: Self-pay

## 2018-02-21 ENCOUNTER — Ambulatory Visit (HOSPITAL_COMMUNITY)
Admission: EM | Admit: 2018-02-21 | Discharge: 2018-02-21 | Disposition: A | Discharge status: Home / Self Care | Payer: Self-pay | Attending: Family Medicine | Admitting: Family Medicine

## 2018-02-21 DIAGNOSIS — S61211A Laceration without foreign body of left index finger without damage to nail, initial encounter: Secondary | ICD-10-CM | POA: Insufficient documentation

## 2018-02-21 DIAGNOSIS — Z23 Encounter for immunization: Secondary | ICD-10-CM

## 2018-02-21 DIAGNOSIS — W298XXA Contact with other powered powered hand tools and household machinery, initial encounter: Secondary | ICD-10-CM

## 2018-02-21 MED ORDER — TETANUS-DIPHTH-ACELL PERTUSSIS 5-2.5-18.5 LF-MCG/0.5 IM SUSP
INTRAMUSCULAR | Status: AC
  Filled 2018-02-21: qty 0.5

## 2018-02-21 MED ORDER — TETANUS-DIPHTH-ACELL PERTUSSIS 5-2.5-18.5 LF-MCG/0.5 IM SUSP
0.5000 mL | Freq: Once | INTRAMUSCULAR | Status: AC
  Administered 2018-02-21: 0.5 mL via INTRAMUSCULAR

## 2018-02-21 MED ORDER — POVIDONE-IODINE 10 % EX SOLN
CUTANEOUS | Status: AC
  Filled 2018-02-21: qty 118

## 2018-02-21 NOTE — Discharge Instructions (Addendum)
We put 3 stitches in the finger for closure Tetanus shot updated Keep the area clean and dry and monitor for infection.  7 days for suture removal
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
## 2018-02-22 NOTE — ED Provider Notes (Addendum)
[facility]    CSN: [id number] Arrival date & time: 02/21/18  [id number]     History   Chief Complaint Chief Complaint  Patient presents with  . Laceration    HPI Eric Osborne is a 28 y.o. male.   Patient is a 28 year old male that presents for a laceration to the left palmar aspect of the index finger at the MCP joint. This occurred at work today when he was using a drill bit and it pierced the finger. He has since had bleeding for the site. He denies  much pain and has full use of the finger with flexion and extension. Denies any numbness, tingling or loss of sensation.   ROS per HPI      Past Medical History:  Diagnosis Date  . Depression   . Hypertension     Patient Active Problem List   Diagnosis Date Noted  . Insomnia 12/11/2013  . Major depression 06/20/2013  . Intentional acetaminophen overdose (HCC) 06/20/2013  . Suicide attempt (HCC) 06/20/2013  . MDD (major depressive disorder) 06/20/2013    Past Surgical History:  Procedure Laterality Date  . NO PAST SURGERIES         Home Medications    Prior to Admission medications   Medication Sig Start Date End Date Taking? Authorizing Provider  citalopram (CELEXA) 20 MG tablet Take 1 tablet (20 mg total) by mouth daily. 12/11/13 12/11/14  Oletta DarterAgarwal, [place], MD  hydrOXYzine (VISTARIL) 25 MG capsule Take 1-2 tabs po qHS prn insomnia 12/11/13   Oletta DarterAgarwal, [place], MD  ibuprofen (ADVIL,MOTRIN) 600 MG tablet Take 1 tablet (600 mg total) by mouth every 6 (six) hours as needed. 05/05/16   Derwood KaplanNanavati, Ankit, MD    Family History Family History  Problem Relation Age of Onset  . Depression Mother   . Hypertension Mother   . Hypertension Father   . Suicidality Neg Hx     Social History Social History   Tobacco Use  . Smoking status: Former Smoker    Last attempt to quit: 08/07/2009    Years since quitting: 8.5  . Smokeless tobacco: Never Used  Substance Use Topics  . Alcohol use: No  . Drug use: No      Allergies   Patient has no known allergies.   Review of Systems Review of Systems   Physical Exam Triage Vital Signs ED Triage Vitals  Enc Vitals Group     BP 02/21/18 0856 120/81     Pulse Rate 02/21/18 0856 70     Resp 02/21/18 0856 18     Temp 02/21/18 0856 98 F (36.7 C)     Temp src --      SpO2 02/21/18 0856 98 %     Weight --      Height --      Head Circumference --      Peak Flow --      Pain Score 02/21/18 0855 7     Pain Loc --      Pain Edu? --      Excl. in GC? --    No data found.  Updated Vital Signs BP 120/81   Pulse 70   Temp 98 F (36.7 C)   Resp 18   SpO2 98%   Visual Acuity Right Eye Distance:   Left Eye Distance:   Bilateral Distance:    Right Eye Near:   Left Eye Near:    Bilateral Near:     Physical Exam Vitals signs  and nursing note reviewed.  Constitutional:      Appearance: He is well-developed.  HENT:     Head: Normocephalic and atraumatic.  Eyes:     Conjunctiva/sclera: Conjunctivae normal.  Neck:     Musculoskeletal: Neck supple.  Cardiovascular:     Rate and Rhythm: Normal rate and regular rhythm.     Heart sounds: No murmur.  Pulmonary:     Effort: Pulmonary effort is normal. No respiratory distress.     Breath sounds: Normal breath sounds.  Abdominal:     Palpations: Abdomen is soft.     Tenderness: There is no abdominal tenderness.  Musculoskeletal: Normal range of motion.        General: Tenderness present.     Comments: Approximate 1 inch laceration to the MCP joint of the left index femur, palmar side. Patient has good flexion and extension of the finger.  Sensation intact.  No obvious deformities.   Skin:    General: Skin is warm and dry.  Neurological:     Mental Status: He is alert.      UC Treatments / Results  Labs (all labs ordered are listed, but only abnormal results are displayed) Labs Reviewed - No data to display  EKG None  Radiology Dg Hand Complete Left  Result Date:  02/21/2018 CLINICAL DATA:  Left hand injury.  Drill bit puncture. EXAM: LEFT HAND - COMPLETE 3+ VIEW COMPARISON:  05/04/2016. FINDINGS: No radiopaque foreign body. Tiny lucency noted the distal left fifth metacarpal. This most likely a tiny cyst. This could represent an injury from a drill bit. No acute fracture or dislocation. IMPRESSION: Tiny lucency noted the distal aspect of the left third metacarpal. This is most likely a tiny cyst. This could represent an injury from a drill bit. No acute fracture or dislocation. No radiopaque foreign body. Electronically Signed   By: Maisie Fus  Register   On: 02/21/2018 09:22    Procedures Laceration Repair Date/Time: 02/22/2018 10:47 AM Performed by: Janace Aris, NP Authorized by: Janace Aris, NP   Consent:    Consent obtained:  Verbal   Consent given by:  Patient   Risks discussed:  Infection, need for additional repair, pain, poor cosmetic result and poor wound healing   Alternatives discussed:  No treatment and delayed treatment Universal protocol:    Patient identity confirmed:  Verbally with patient Anesthesia (see MAR for exact dosages):    Anesthesia method:  Local infiltration   Local anesthetic:  Lidocaine 2% w/o epi Laceration details:    Location:  Finger   Finger location:  L index finger Repair type:    Repair type:  Intermediate Pre-procedure details:    Preparation:  Patient was prepped and draped in usual sterile fashion Exploration:    Hemostasis achieved with:  Direct pressure   Wound extent: no nerve damage noted, no tendon damage noted and no underlying fracture noted     Contaminated: no   Treatment:    Area cleansed with:  Betadine and saline   Amount of cleaning:  Standard   Irrigation solution:  Sterile saline   Irrigation method:  Pressure wash   Visualized foreign bodies/material removed: no   Skin repair:    Repair method:  Sutures   Suture size:  4-0   Suture material:  Prolene   Suture technique:  Simple  interrupted   Number of sutures:  3 Approximation:    Approximation:  Close Post-procedure details:    Dressing:  Bulky dressing  Patient tolerance of procedure:  Tolerated well, no immediate complications   (including critical care time)  Medications Ordered in UC Medications  Tdap (BOOSTRIX) injection 0.5 mL (0.5 mLs Intramuscular Given 02/21/18 0924)    Initial Impression / Assessment and Plan / UC Course  I have reviewed the triage vital signs and the nursing notes.  Pertinent labs & imaging results that were available during my care of the patient were reviewed by me and considered in my medical decision making (see chart for details).     Patient is a 28 year old male that comes in with laceration to the left index finger.  His x-ray revealed no abnormalities.  Exam revealed full range of motion of the finger and there does not appear to be any tendon involvement.  We will go ahead and suture the area after full inspection and cleaning of the area.  Patient tolerated procedure well.  3 sutures placed to the left index femur palmar side. Tetanus updated in clinic.  We will have patient return in 7 days for suture removal.  Instructed to watch for signs of infection. Final Clinical Impressions(s) / UC Diagnoses   Final diagnoses:  Laceration of left index finger without foreign body without damage to nail, initial encounter     Discharge Instructions     We put 3 stitches in the finger for closure Tetanus shot updated Keep the area clean and dry and monitor for infection.  7 days for suture removal      ED Prescriptions    None     Controlled Substance Prescriptions [place] Controlled Substance Registry consulted? no   Janace ArisBast, Johnnette Laux A, NP 02/22/18 1046    Dahlia ByesBast, Tavoris Brisk A, NP 02/22/18 1048
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
## 2018-05-12 ENCOUNTER — Telehealth: Payer: Self-pay | Admitting: Family

## 2018-05-12 DIAGNOSIS — R6889 Other general symptoms and signs: Principal | ICD-10-CM

## 2018-05-12 DIAGNOSIS — Z20822 Contact with and (suspected) exposure to covid-19: Secondary | ICD-10-CM

## 2018-05-12 MED ORDER — BENZONATATE 100 MG PO CAPS: 100.0000 mg | ORAL_CAPSULE | Freq: Three times a day (TID) | ORAL | 0 refills | Status: DC | PRN

## 2018-05-12 NOTE — Progress Notes (Signed)
E-Visit for Corona Virus Screening  Based on your current symptoms, you may very well have the virus, however your symptoms are mild. Currently, not all patients are being tested. If the symptoms are mild and there is not a known exposure, performing the test is not indicated.  Coronavirus disease 2019 (COVID-19) is a respiratory illness that can spread from person to person. The virus that causes COVID-19 is a new virus that was first identified in the country of [place] but is now found in multiple other countries and has spread to the [place].  Symptoms associated with the virus are mild to severe fever, cough, and shortness of breath. There is currently no vaccine to protect against COVID-19, and there is no specific antiviral treatment for the virus.   To be considered HIGH RISK for Coronavirus (COVID-19), you have to meet the following criteria:  . Traveled to [place], [place], [place], [place] or [place]; or in the [place] to [place], [place], [place], or [place]; and have fever, cough, and shortness of breath within the last 2 weeks of travel OR  . Been in close contact with a person diagnosed with COVID-19 within the last 2 weeks and have fever, cough, and shortness of breath  . IF YOU DO NOT MEET THESE CRITERIA, YOU ARE CONSIDERED LOW RISK FOR COVID-19.   It is vitally important that if you feel that you have an infection such as this virus or any other virus that you stay home and away from places where you may spread it to others.  You should self-quarantine for 14 days if you have symptoms that could potentially be coronavirus and avoid contact with people age 24 and older.   You can use medication such as A prescription cough medication called Tessalon Perles 100 mg. You may take 1-2 capsules every 8 hours as needed for cough  You may also take acetaminophen (Tylenol) as needed for fever.  Approximately 5 minutes was spent documenting and reviewing patient's chart.    Reduce your risk of any infection by using the same precautions used for avoiding the common cold or flu:  Marland Kitchen Wash your hands often with soap and warm water for at least 20 seconds.  If soap and water are not readily available, use an alcohol-based hand sanitizer with at least 60% alcohol.  . If coughing or sneezing, cover your mouth and nose by coughing or sneezing into the elbow areas of your shirt or coat, into a tissue or into your sleeve (not your hands). . Avoid shaking hands with others and consider head nods or verbal greetings only. . Avoid touching your eyes, nose, or mouth with unwashed hands.  . Avoid close contact with people who are sick. . Avoid places or events with large numbers of people in one location, like concerts or sporting events. . Carefully consider travel plans you have or are making. . If you are planning any travel outside or inside the [place], visit the CDC's Travelers' Health webpage for the latest health notices. . If you have some symptoms but not all symptoms, continue to monitor at home and seek medical attention if your symptoms worsen. . If you are having a medical emergency, call 911.  HOME CARE . Only take medications as instructed by your medical team. . Drink plenty of fluids and get plenty of rest. . A steam or ultrasonic humidifier can help if you have congestion.   GET HELP RIGHT AWAY IF: . You develop  worsening fever. . You become short of breath . You cough up blood. . Your symptoms become more severe MAKE SURE YOU   Understand these instructions.  Will watch your condition.  Will get help right away if you are not doing well or get worse.  Your e-visit answers were reviewed by a board certified advanced clinical practitioner to complete your personal care plan.  Depending on the condition, your plan could have included both over the counter or prescription medications.  If there is a problem please reply once you have received a response  from your provider. Your safety is important to [place].  If you have drug allergies check your prescription carefully.    You can use MyChart to ask questions about today's visit, request a non-urgent call back, or ask for a work or school excuse for 24 hours related to this e-Visit. If it has been greater than 24 hours you will need to follow up with your provider, or enter a new e-Visit to address those concerns. You will get an e-mail in the next two days asking about your experience.  I hope that your e-visit has been valuable and will speed your recovery. Thank you for using e-visits.

## 2018-05-13 ENCOUNTER — Other Ambulatory Visit: Payer: Self-pay

## 2018-05-13 ENCOUNTER — Encounter (HOSPITAL_COMMUNITY): Payer: Self-pay

## 2018-05-13 ENCOUNTER — Ambulatory Visit (HOSPITAL_COMMUNITY)
Admission: EM | Admit: 2018-05-13 | Discharge: 2018-05-13 | Disposition: A | Discharge status: Home / Self Care | Payer: Self-pay | Attending: Emergency Medicine | Admitting: Emergency Medicine

## 2018-05-13 DIAGNOSIS — J020 Streptococcal pharyngitis: Secondary | ICD-10-CM

## 2018-05-13 DIAGNOSIS — J302 Other seasonal allergic rhinitis: Secondary | ICD-10-CM

## 2018-05-13 LAB — POCT RAPID STREP A: Streptococcus, Group A Screen (Direct): POSITIVE — AB

## 2018-05-13 MED ORDER — FLUTICASONE PROPIONATE 50 MCG/ACT NA SUSP: 2.0000 | Freq: Every day | NASAL | 0 refills | Status: DC

## 2018-05-13 MED ORDER — PENICILLIN G BENZATHINE 1200000 UNIT/2ML IM SUSP
1.2000 10*6.[IU] | Freq: Once | INTRAMUSCULAR | Status: AC
  Administered 2018-05-13: 10:00:00 1.2 10*6.[IU] via INTRAMUSCULAR

## 2018-05-13 MED ORDER — IBUPROFEN 600 MG PO TABS: 600.0000 mg | ORAL_TABLET | Freq: Four times a day (QID) | ORAL | 0 refills | Status: DC | PRN

## 2018-05-13 MED ORDER — PENICILLIN G BENZATHINE 1200000 UNIT/2ML IM SUSP
INTRAMUSCULAR | Status: AC
  Filled 2018-05-13: qty 2

## 2018-05-13 NOTE — ED Provider Notes (Signed)
HPI  SUBJECTIVE:  Patient reports sore throat starting 3 days ago, getting worse today. Sx worse with swallowing.  Sx better with nothing. Has been taking DayQuil, NyQuil, Claritin, salt water gargles w/ o relief.  + Fever tmax 101   No neck stiffness  No Cough/URI sxs No Myalgias No Headache No Rash     No Recent Strep Exposure No Abdominal Pain No reflux sxs + Allergy sxs-congestion, rhinorrhea, sneezing, itchy, watery eyes, postnasal drip.  No Breathing difficulty, voice changes, and station of throat swelling shut. No Drooling No Trismus No abx in past month.  No antipyretic in past 4-6 hrs  Past medical history negative for frequent strep, mono, diabetes.  He has a history of hypertension but is not on any medications at this point in time. PMD: None.   Past Medical History:  Diagnosis Date  . Depression   . Hypertension     Past Surgical History:  Procedure Laterality Date  . NO PAST SURGERIES      Family History  Problem Relation Age of Onset  . Depression Mother   . Hypertension Mother   . Hypertension Father   . Suicidality Neg Hx     Social History   Tobacco Use  . Smoking status: Former Smoker    Last attempt to quit: 08/07/2009    Years since quitting: 8.7  . Smokeless tobacco: Never Used  Substance Use Topics  . Alcohol use: No  . Drug use: No     Current Facility-Administered Medications:  .  penicillin g benzathine (BICILLIN LA) 1200000 UNIT/2ML injection 1.2 Million Units, 1.2 Million Units, Intramuscular, Once, Domenick Gong, MD  Current Outpatient Medications:  .  citalopram (CELEXA) 20 MG tablet, Take 1 tablet (20 mg total) by mouth daily., Disp: 90 tablet, Rfl: 0 .  fluticasone (FLONASE) 50 MCG/ACT nasal spray, Place 2 sprays into both nostrils daily., Disp: 16 g, Rfl: 0 .  hydrOXYzine (VISTARIL) 25 MG capsule, Take 1-2 tabs po qHS prn insomnia, Disp: 60 capsule, Rfl: 2 .  ibuprofen (ADVIL,MOTRIN) 600 MG tablet, Take 1 tablet  (600 mg total) by mouth every 6 (six) hours as needed., Disp: 30 tablet, Rfl: 0  No Known Allergies   ROS  As noted in HPI.   Physical Exam  BP 137/79 (BP Location: Right Arm)   Pulse 90   Temp 99.5 F (37.5 C) (Oral)   Resp 18   Wt 115.7 kg   SpO2 98%   BMI 35.57 kg/m   Constitutional: Well developed, well nourished, no acute distress Eyes:  EOMI, conjunctiva normal bilaterally HENT: Normocephalic, atraumatic,mucus membranes moist. - nasal congestion, normal tonsils.  +  erythematous oropharynx + enlarged tonsils  - exudates. Uvula midline.  No petechiae on palate.  Positive cobblestoning, postnasal drip. Respiratory: Normal inspiratory effort Cardiovascular: Normal rate, no murmurs, rubs, gallops GI: nondistended, nontender. No appreciable splenomegaly skin: No rash, skin intact Lymph: + Anterior cervical LN.  No posterior cervical lymphadenopathy Musculoskeletal: no deformities Neurologic: Alert & oriented x 3, no focal neuro deficits Psychiatric: Speech and behavior appropriate.   ED Course   Medications  penicillin g benzathine (BICILLIN LA) 1200000 UNIT/2ML injection 1.2 Million Units (has no administration in time range)    Orders Placed This Encounter  Procedures  . POCT rapid strep A [facility])    Standing Status:   Standing    Number of Occurrences:   1    Results for orders placed or performed during the hospital encounter of  05/13/18 (from the past 24 hour(s))  POCT rapid strep A [facility])     Status: Abnormal   Collection Time: 05/13/18  9:05 AM  Result Value Ref Range   Streptococcus, Group A Screen (Direct) POSITIVE (A) NEGATIVE   No results found.  ED Clinical Impression  Strep pharyngitis   ED Assessment/Plan  Rapid strep positive.  he has opted for 1,200,000 units of Bicillin IM x1.  Home with ibuprofen, Tylenol, Benadryl/Maalox mixture.  He is to also start an antihistamine/decongestant combination, such as Claritin,  Allegra, Zyrtec-D, Flonase, saline nasal irrigation for his allergies.  Patient to followup with PMD of choice when necessary, will refer to local primary care resources.  Discussed labs,  MDM, plan and followup with patient. Discussed sn/sx that should prompt return to the ED. patient agrees with plan.   Meds ordered this encounter  Medications  . penicillin g benzathine (BICILLIN LA) 1200000 UNIT/2ML injection 1.2 Million Units    Order Specific Question:   Antibiotic Indication:    Answer:   Pharyngitis  . fluticasone (FLONASE) 50 MCG/ACT nasal spray    Sig: Place 2 sprays into both nostrils daily.    Dispense:  16 g    Refill:  0  . ibuprofen (ADVIL,MOTRIN) 600 MG tablet    Sig: Take 1 tablet (600 mg total) by mouth every 6 (six) hours as needed.    Dispense:  30 tablet    Refill:  0     *This clinic note was created using Scientist, clinical (histocompatibility and immunogenetics). Therefore, there may be occasional mistakes despite careful proofreading.    Domenick Gong, MD 05/13/18 [phone]

## 2018-05-13 NOTE — Discharge Instructions (Addendum)
Have treated you for strep throat today with a shot of penicillin.  You do not need any further antibiotics.  1 gram of Tylenol and 600 mg ibuprofen together 3-4 times a day as needed for pain.  Make sure you drink plenty of extra fluids.  Some people find salt water gargles and  Traditional Medicinal's "Throat Coat" tea helpful. Take 5 mL of liquid Benadryl and 5 mL of Maalox. Mix it together, and then hold it in your mouth for as long as you can and then swallow. You may do this 4 times a day.    Try an antihistamine/decongestant combination, such as Claritin-D, Allegra-D, Zyrtec-D, Flonase, saline nasal irrigation with a Lloyd Huger med rinse with distilled water as often as you want for your allergies.  Below is a list of primary care practices who are taking new patients for you to follow-up with.  [facility] at [facility] [address] [place], [place] [address] [phone]  [facility] [address] [place], [place] [address] [phone]  [facility] [phone] [address] [place] [address]  [facility]: [address] [place] [address]  [phone]  [facility]: [address] [place] [address]   [phone]  [facility]: [address] [place] [address]  [phone]  [place] [facility] : [address] [place] [address] [phone]  [facility]: [address] [place] [address] [phone]  [facility]: [address] [place] [address] [phone]  Dr. Oneal Grout [address] [facility] [place] [address]  [phone]  Dr. Jackie Plum, [facility]. [address]  [place], [place] [address]  [phone]  Go to www.goodrx.com to look up your medications. This will give you a list of where you can find your prescriptions at the most affordable prices. Or ask the pharmacist what the cash price is, or if they have any other discount programs available to help make your medication more affordable. This can be less expensive than what you would pay with insurance.    Go to www.goodrx.com to look up your medications. This will give you a list of where you can find your prescriptions at the most affordable prices. Or ask the pharmacist what the cash price is, or if they have any other discount programs available to help make your medication more affordable. This can be less expensive than what you would pay with insurance.

## 2018-05-13 NOTE — ED Triage Notes (Signed)
Pt states he has a sore throat and fever x 2 days.

## 2018-05-18 ENCOUNTER — Emergency Department (HOSPITAL_COMMUNITY)
Admission: EM | Admit: 2018-05-18 | Discharge: 2018-05-18 | Disposition: A | Discharge status: Home / Self Care | Payer: Self-pay | Attending: Emergency Medicine | Admitting: Emergency Medicine

## 2018-05-18 ENCOUNTER — Ambulatory Visit (HOSPITAL_COMMUNITY)
Admission: EM | Admit: 2018-05-18 | Discharge: 2018-05-18 | Disposition: A | Discharge status: Home / Self Care | Payer: Self-pay

## 2018-05-18 ENCOUNTER — Other Ambulatory Visit: Payer: Self-pay

## 2018-05-18 ENCOUNTER — Emergency Department (HOSPITAL_COMMUNITY): Payer: Self-pay

## 2018-05-18 ENCOUNTER — Encounter (HOSPITAL_COMMUNITY): Payer: Self-pay

## 2018-05-18 DIAGNOSIS — Z79899 Other long term (current) drug therapy: Secondary | ICD-10-CM | POA: Insufficient documentation

## 2018-05-18 DIAGNOSIS — J36 Peritonsillar abscess: Secondary | ICD-10-CM

## 2018-05-18 DIAGNOSIS — I10 Essential (primary) hypertension: Secondary | ICD-10-CM | POA: Insufficient documentation

## 2018-05-18 DIAGNOSIS — Z87891 Personal history of nicotine dependence: Secondary | ICD-10-CM | POA: Insufficient documentation

## 2018-05-18 LAB — CBC WITH DIFFERENTIAL/PLATELET
Abs Immature Granulocytes: 0.11 10*3/uL — ABNORMAL HIGH (ref 0.00–0.07)
Basophils Absolute: 0 10*3/uL (ref 0.0–0.1)
Basophils Relative: 0 %
Eosinophils Absolute: 0.1 10*3/uL (ref 0.0–0.5)
Eosinophils Relative: 1 %
HCT: 40.3 % (ref 39.0–52.0)
Hemoglobin: 13.6 g/dL (ref 13.0–17.0)
Immature Granulocytes: 1 %
Lymphocytes Relative: 17 %
Lymphs Abs: 2.2 10*3/uL (ref 0.7–4.0)
MCH: 28.7 pg (ref 26.0–34.0)
MCHC: 33.7 g/dL (ref 30.0–36.0)
MCV: 85 fL (ref 80.0–100.0)
Monocytes Absolute: 1 10*3/uL (ref 0.1–1.0)
Monocytes Relative: 7 %
Neutro Abs: 9.6 10*3/uL — ABNORMAL HIGH (ref 1.7–7.7)
Neutrophils Relative %: 74 %
Platelets: 323 10*3/uL (ref 150–400)
RBC: 4.74 MIL/uL (ref 4.22–5.81)
RDW: 11.9 % (ref 11.5–15.5)
WBC: 13 10*3/uL — ABNORMAL HIGH (ref 4.0–10.5)
nRBC: 0 % (ref 0.0–0.2)

## 2018-05-18 LAB — BASIC METABOLIC PANEL
Anion gap: 11 (ref 5–15)
BUN: 15 mg/dL (ref 6–20)
CO2: 25 mmol/L (ref 22–32)
Calcium: 9.4 mg/dL (ref 8.9–10.3)
Chloride: 101 mmol/L (ref 98–111)
Creatinine, Ser: 0.7 mg/dL (ref 0.61–1.24)
GFR calc Af Amer: >60 mL/min (ref >60)
GFR calc non Af Amer: >60 mL/min (ref >60)
Glucose, Bld: 107 mg/dL — ABNORMAL HIGH (ref 70–99)
Potassium: 4 mmol/L (ref 3.5–5.1)
Sodium: 137 mmol/L (ref 135–145)

## 2018-05-18 MED ORDER — HYDROCODONE-ACETAMINOPHEN 5-325 MG PO TABS: 2.0000 | ORAL_TABLET | Freq: Four times a day (QID) | ORAL | 0 refills | Status: DC | PRN

## 2018-05-18 MED ORDER — LACTATED RINGERS IV BOLUS
1000.0000 mL | Freq: Once | INTRAVENOUS | Status: AC
  Administered 2018-05-18: 1000 mL via INTRAVENOUS

## 2018-05-18 MED ORDER — AMOXICILLIN-POT CLAVULANATE 875-125 MG PO TABS: 1.0000 | ORAL_TABLET | Freq: Two times a day (BID) | ORAL | 0 refills | Status: DC

## 2018-05-18 MED ORDER — IOHEXOL 300 MG/ML  SOLN
75.0000 mL | Freq: Once | INTRAMUSCULAR | Status: AC | PRN
  Administered 2018-05-18: 13:00:00 75 mL via INTRAVENOUS

## 2018-05-18 MED ORDER — SODIUM CHLORIDE 0.9 % IV SOLN
INTRAVENOUS | Status: DC | PRN
  Administered 2018-05-18: 500 mL via INTRAVENOUS

## 2018-05-18 MED ORDER — CLINDAMYCIN PHOSPHATE 900 MG/50ML IV SOLN
900.0000 mg | Freq: Once | INTRAVENOUS | Status: AC
  Administered 2018-05-18: 900 mg via INTRAVENOUS
  Filled 2018-05-18: qty 50

## 2018-05-18 MED ORDER — DEXAMETHASONE SODIUM PHOSPHATE 10 MG/ML IJ SOLN
10.0000 mg | Freq: Once | INTRAMUSCULAR | Status: AC
  Administered 2018-05-18: 10 mg via INTRAVENOUS
  Filled 2018-05-18: qty 1

## 2018-05-18 NOTE — Discharge Instructions (Addendum)
Please take all of your antibiotics until finished!   You may develop abdominal discomfort or diarrhea from the antibiotic.  You may help offset this with probiotics which you can buy or get in yogurt. Do not eat  or take the probiotics until 2 hours after your antibiotic.   You can alternate 600 mg of ibuprofen and [phone] mg of Tylenol every 3 hours as needed for pain. Do not exceed 4000 mg of Tylenol daily.  Take ibuprofen with food to avoid upset stomach issues.  You can take hydrocodone as needed for severe pain if the ibuprofen and Tylenol do not work.  This medication can make you drowsy so do not drive, drink alcohol, or operate heavy machinery if you are going to take this medication. This may also cause constipation, so you can take a stool softener if it does.  Be aware this medication also contains Tylenol.  You can also cut these tablets in half.  Follow-up with Dr. Jearld Fenton in the office in 2 weeks for follow-up.  You do not have to do so if you are completely asymptomatic.  If you have any recurrence of symptoms you can also follow-up sooner in his office.  Return to the emergency department if any concerning signs or symptoms develop such as difficulty breathing or swallowing, drooling, throat tightness.

## 2018-05-18 NOTE — ED Triage Notes (Signed)
Per pt he was here on Monday and got a penicillin shot for strep and was feeling some better. But still has some issues on the right side of his throat with tenderness and hurting in his right jaw and right ear. No fevers no chills. Ibuprofen is working

## 2018-05-18 NOTE — ED Notes (Signed)
ED Provider at bedside. 

## 2018-05-18 NOTE — ED Provider Notes (Signed)
Medical screening examination/treatment/procedure(s) were conducted as a shared visit with non-physician practitioner(s) and myself.  I personally evaluated the patient during the encounter. Briefly, the patient is a 28 y.o. male with no significant medical history presents to the ED from urgent care with right-sided tonsillar abscess concern.  Patient diagnosed with strep throat several days ago and was given IM penicillin.  States that pain and swelling has increased over this time.  Has change in his voice.  However, no trismus on exam, no drooling.  Appears to have large right-sided tonsillar abscess with uvula deviation over to the left.  Appears to extend into the soft palate.  Slightly muffled voice, but no signs of respiratory distress.  Will give IV fluids, IV Decadron, IV clindamycin and get a CT scan of the neck.  Will likely touch base with ENT for likely I&D.  Patient with peritonsillar abscess.  Mild leukocytosis.  Dr. Jearld Fenton with ENT came and successfully I&D peritonsillar abscess.  Discharged to home with antibiotics.  To follow-up with ENT.  Given return precautions.  This chart was dictated using voice recognition software.  Despite best efforts to proofread,  errors can occur which can change the documentation meaning.     EKG Interpretation None           Virgina Norfolk, DO 05/18/18 1511

## 2018-05-18 NOTE — ED Notes (Signed)
Sent to ed 

## 2018-05-18 NOTE — ED Provider Notes (Signed)
[facility] Provider Note   CSN: [id number] Arrival date & time: 05/18/18  1040    History   Chief Complaint Chief Complaint  Patient presents with  . Sore Throat    throat pain/swelling    HPI Eric Osborne is a 28 y.o. male presents for evaluation of acute onset, progressively worsening sore throat for 6 days.  Went to urgent care on 05/13/2018 tested positive for strep and received IM penicillin.  He reports that his symptoms improved over the course of a few days but then progressively worsened.  2 days ago he noted voice change and has been having difficulty sleeping as he feels that there is something stuck in his throat.  He notes dysphagia but denies drooling.  Endorses excessive salivation.  Denies chest pain, shortness of breath, abdominal pain, nausea, vomiting.  He did have fevers initially but none recently.  Has been taking ibuprofen with little relief. Pain now primarily on the right but was bilateral initially.  He went to urgent care earlier today and was sent to the ED for evaluation of possible tonsillar/peritonsillar abscess.     The history is provided by the patient.    Past Medical History:  Diagnosis Date  . Depression   . Hypertension     Patient Active Problem List   Diagnosis Date Noted  . Insomnia 12/11/2013  . Major depression 06/20/2013  . Intentional acetaminophen overdose (HCC) 06/20/2013  . Suicide attempt (HCC) 06/20/2013  . MDD (major depressive disorder) 06/20/2013    Past Surgical History:  Procedure Laterality Date  . NO PAST SURGERIES          Home Medications    Prior to Admission medications   Medication Sig Start Date End Date Taking? Authorizing Provider  amoxicillin-clavulanate (AUGMENTIN) 875-125 MG tablet Take 1 tablet by mouth every 12 (twelve) hours. 05/18/18   Marquis Down A, PA-C  citalopram (CELEXA) 20 MG tablet Take 1 tablet (20 mg total) by mouth daily. 12/11/13 12/11/14   Oletta DarterAgarwal, [place], MD  fluticasone (FLONASE) 50 MCG/ACT nasal spray Place 2 sprays into both nostrils daily. 05/13/18   Domenick GongMortenson, Ashley, MD  HYDROcodone-acetaminophen (NORCO/VICODIN) 5-325 MG tablet Take 2 tablets by mouth every 6 (six) hours as needed for severe pain. 05/18/18   Luevenia MaxinFawze, Harris Penton A, PA-C  hydrOXYzine (VISTARIL) 25 MG capsule Take 1-2 tabs po qHS prn insomnia 12/11/13   Oletta DarterAgarwal, [place], MD  ibuprofen (ADVIL,MOTRIN) 600 MG tablet Take 1 tablet (600 mg total) by mouth every 6 (six) hours as needed. 05/13/18   Domenick GongMortenson, Ashley, MD    Family History Family History  Problem Relation Age of Onset  . Depression Mother   . Hypertension Mother   . Hypertension Father   . Suicidality Neg Hx     Social History Social History   Tobacco Use  . Smoking status: Former Smoker    Last attempt to quit: 08/07/2009    Years since quitting: 8.7  . Smokeless tobacco: Never Used  Substance Use Topics  . Alcohol use: No    Comment: 1-2 beers a week  . Drug use: No     Allergies   Patient has no known allergies.   Review of Systems Review of Systems  HENT: Positive for sore throat, trouble swallowing and voice change. Negative for drooling.   Respiratory: Negative for shortness of breath.   Cardiovascular: Negative for chest pain.  Gastrointestinal: Negative for abdominal pain, nausea and vomiting.  Musculoskeletal: Negative for neck stiffness.  All other systems reviewed and are negative.    Physical Exam Updated Vital Signs BP 127/70 (BP Location: Right Arm)   Pulse 78   Temp 99 F (37.2 C) (Oral)   Resp 19   Ht 6' (1.829 m)   Wt 117.9 kg   SpO2 99%   BMI 35.26 kg/m   Physical Exam HENT:     Mouth/Throat:     Tonsils: Tonsillar abscess present. 3+ on the right.     Comments: Right tonsil with erythema and swelling.  There is surrounding peritonsillar swelling extending into the soft palate.  There is uvular deviation to the left.  Patient does speak in a somewhat muffled  voice.  He is tolerating secretions without difficulty.  No upper airway stridor. Neck:     Musculoskeletal: Normal range of motion and neck supple.     Comments: Right anterior cervical lymphadenopathy Cardiovascular:     Rate and Rhythm: Normal rate and regular rhythm.  Pulmonary:     Effort: Pulmonary effort is normal.     Breath sounds: Normal breath sounds.  Abdominal:     General: Bowel sounds are normal.     Palpations: Abdomen is soft.     Tenderness: There is no abdominal tenderness. There is no guarding.  Lymphadenopathy:     Cervical: Cervical adenopathy present.  Skin:    General: Skin is dry.      ED Treatments / Results  Labs (all labs ordered are listed, but only abnormal results are displayed) Labs Reviewed  CBC WITH DIFFERENTIAL/PLATELET - Abnormal; Notable for the following components:      Result Value   WBC 13.0 (*)    Neutro Abs 9.6 (*)    Abs Immature Granulocytes 0.11 (*)    All other components within normal limits  BASIC METABOLIC PANEL - Abnormal; Notable for the following components:   Glucose, Bld 107 (*)    All other components within normal limits  I-STAT CREATININE, ED    EKG None  Radiology Ct Soft Tissue Neck W Contrast  Result Date: 05/18/2018 CLINICAL DATA:  Strep throat.  Throat swelling and pain EXAM: CT NECK WITH CONTRAST TECHNIQUE: Multidetector CT imaging of the neck was performed using the standard protocol following the bolus administration of intravenous contrast. CONTRAST:  62mL OMNIPAQUE IOHEXOL 300 MG/ML  SOLN COMPARISON:  None. FINDINGS: Pharynx and larynx: Large right peritonsillar fluid collection measuring 32 x 21 mm compatible with abscess. Surrounding soft tissue edema. Epiglottis and larynx normal. Airway intact. Salivary glands: No inflammation, mass, or stone. Thyroid: Negative Lymph nodes: Reactive lymph nodes in the neck bilaterally. Largest unload is a 14 mm right level 2 node. Additional multiple subcentimeter nodes  in the neck bilaterally. Vascular: Normal vascular enhancement Limited intracranial: Negative Visualized orbits: Negative Mastoids and visualized paranasal sinuses: Negative Skeleton: Negative Upper chest: Negative Other: None IMPRESSION: Large right peritonsillar abscess 32 x 31 mm. Reactive lymph nodes in the neck bilaterally. No airway compromise. Electronically Signed   By: Marlan Palau M.D.   On: 05/18/2018 13:03    Procedures Procedures (including critical care time)  Medications Ordered in ED Medications  0.9 %  sodium chloride infusion ( Intravenous Stopped 05/18/18 1338)  clindamycin (CLEOCIN) IVPB 900 mg (0 mg Intravenous Stopped 05/18/18 1226)  dexamethasone (DECADRON) injection 10 mg (10 mg Intravenous Given 05/18/18 1130)  lactated ringers bolus 1,000 mL (0 mLs Intravenous Stopped 05/18/18 1335)  iohexol (OMNIPAQUE) 300 MG/ML solution 75 mL (75 mLs Intravenous Contrast Given 05/18/18  1240)     Initial Impression / Assessment and Plan / ED Course  I have reviewed the triage vital signs and the nursing notes.  Pertinent labs & imaging results that were available during my care of the patient were reviewed by me and considered in my medical decision making (see chart for details).        Patient sent from urgent care for evaluation of possible peritonsillar abscess.  He is afebrile, initially hypertensive with resolution on subsequent reevaluation's.  He is nontoxic in appearance.  Airway appears patent and he is tolerating secretions without difficulty.  Examination consistent with likely right-sided peritonsillar abscess.  Lab work reviewed by me significant for leukocytosis, no anemia, no metabolic derangements.  CT soft tissue neck shows a large right peritonsillar abscess measuring 32 x 31 mm with reactive lymph nodes bilaterally with no evidence of airway compromise.  I spoke with Dr. Jearld Fenton with ENT who will see the patient in the ED emergently for drainage.  3:00PM Patient  underwent I&D with Dr. Jearld Fenton.  Tolerated the procedure without difficulty.  Will discharge with Augmentin and hydrocodone as needed for severe pain.  [place] controlled substance registry reviewed with no inconsistencies.  He understands to follow-up with Dr. Jearld Fenton in the office within 72 hours that he has a recurrence of symptoms and in 2 weeks for general follow-up. Discussed strict ED return precautions. Pt verbalized understanding of and agreement with plan and is safe for discharge home at this time.  Patient seen and evaluated by Dr. Lockie Mola who agrees with assessment and plan at this time.  Final Clinical Impressions(s) / ED Diagnoses   Final diagnoses:  Peritonsillar abscess    ED Discharge Orders         Ordered    amoxicillin-clavulanate (AUGMENTIN) 875-125 MG tablet  Every 12 hours     05/18/18 1516    HYDROcodone-acetaminophen (NORCO/VICODIN) 5-325 MG tablet  Every 6 hours PRN     05/18/18 1516           Jeanie Sewer, PA-C 05/18/18 1559    Virgina Norfolk, DO 05/18/18 1623

## 2018-05-18 NOTE — ED Notes (Signed)
Patient transported to CT 

## 2018-05-18 NOTE — ED Provider Notes (Signed)
[facility]    CSN: [id number] Arrival date & time: 05/18/18  1008     History   Chief Complaint Chief Complaint  Patient presents with  . Sore Throat    HPI Zymarion Disotell is a 28 y.o. male.   HPI Worsening throat pain despite bicillin at appropriate dose for strep.  No fever. Now associates ear pain.   Past Medical History:  Diagnosis Date  . Depression   . Hypertension     Patient Active Problem List   Diagnosis Date Noted  . Insomnia 12/11/2013  . Major depression 06/20/2013  . Intentional acetaminophen overdose (HCC) 06/20/2013  . Suicide attempt (HCC) 06/20/2013  . MDD (major depressive disorder) 06/20/2013    Past Surgical History:  Procedure Laterality Date  . NO PAST SURGERIES         Home Medications    Prior to Admission medications   Medication Sig Start Date End Date Taking? Authorizing Provider  citalopram (CELEXA) 20 MG tablet Take 1 tablet (20 mg total) by mouth daily. 12/11/13 12/11/14  Oletta Darter, MD  fluticasone (FLONASE) 50 MCG/ACT nasal spray Place 2 sprays into both nostrils daily. 05/13/18   Domenick Gong, MD  hydrOXYzine (VISTARIL) 25 MG capsule Take 1-2 tabs po qHS prn insomnia 12/11/13   Oletta Darter, MD  ibuprofen (ADVIL,MOTRIN) 600 MG tablet Take 1 tablet (600 mg total) by mouth every 6 (six) hours as needed. 05/13/18   Domenick Gong, MD    Family History Family History  Problem Relation Age of Onset  . Depression Mother   . Hypertension Mother   . Hypertension Father   . Suicidality Neg Hx     Social History Social History   Tobacco Use  . Smoking status: Former Smoker    Last attempt to quit: 08/07/2009    Years since quitting: 8.7  . Smokeless tobacco: Never Used  Substance Use Topics  . Alcohol use: No  . Drug use: No     Allergies   Patient has no known allergies.   Review of Systems Review of Systems   Physical Exam Triage Vital Signs ED Triage Vitals  Enc Vitals Group     BP  05/18/18 1022 (!) 150/81     Pulse Rate 05/18/18 1022 76     Resp 05/18/18 1022 16     Temp 05/18/18 1022 98.8 F (37.1 C)     Temp Source 05/18/18 1022 Oral     SpO2 05/18/18 1022 100 %     Weight --      Height --      Head Circumference --      Peak Flow --      Pain Score 05/18/18 1025 9     Pain Loc --      Pain Edu? --      Excl. in GC? --    No data found.  Updated Vital Signs BP (!) 150/81 (BP Location: Right Arm)   Pulse 76   Temp 98.8 F (37.1 C) (Oral)   Resp 16   SpO2 100%   Visual Acuity Right Eye Distance:   Left Eye Distance:   Bilateral Distance:    Right Eye Near:   Left Eye Near:    Bilateral Near:     Physical Exam Vitals signs and nursing note reviewed.  Constitutional:      Appearance: He is well-developed. He is not ill-appearing or diaphoretic.  HENT:     Mouth/Throat:     Tonsils:  Tonsillar abscess present. No tonsillar exudate.   Eyes:     Conjunctiva/sclera: Conjunctivae normal.     Pupils: Pupils are equal, round, and reactive to light.  Cardiovascular:     Rate and Rhythm: Normal rate.  Pulmonary:     Effort: Pulmonary effort is normal.  Abdominal:     General: There is no distension.  Musculoskeletal: Normal range of motion.  Skin:    General: Skin is warm and dry.  Neurological:     Mental Status: He is alert and oriented to person, place, and time.     Cranial Nerves: No cranial nerve deficit.     Coordination: Coordination normal.      UC Treatments / Results  Labs (all labs ordered are listed, but only abnormal results are displayed) Labs Reviewed - No data to display  EKG None  Radiology No results found.  Procedures Procedures (including critical care time)  Medications Ordered in UC Medications - No data to display  Initial Impression / Assessment and Plan / UC Course  I have reviewed the triage vital signs and the nursing notes.  Pertinent labs & imaging results that were available during my care  of the patient were reviewed by me and considered in my medical decision making (see chart for details).     Previously treated for GAS with IM pen now with uvular displacement. Dispoed to ED for likely imaging and I/D.    Final Clinical Impressions(s) / UC Diagnoses   Final diagnoses:  Peritonsillar abscess determined by examination   Discharge Instructions   None    ED Prescriptions    None     Controlled Substance Prescriptions [place] Controlled Substance Registry consulted? Not Applicable   Ofilia NeasClark,  L, PA-C 05/18/18 1039

## 2018-05-18 NOTE — ED Notes (Signed)
Patient verbalizes understanding of discharge instructions. Opportunity for questioning and answers were provided. Armband removed by staff, pt discharged from ED.  

## 2018-05-18 NOTE — ED Triage Notes (Signed)
Pt arrives from POV from [facility] care c/o sore throat/throat pain that began last Sunday. Pt reports going to urgent care on Monday and received penicillin shot and ibuprofen. Pt reports he's been feeling worse; he went back to urgent care today and was advised to come to the ED to be evaluated due to having a possible abscess in throat.

## 2018-05-18 NOTE — Consult Note (Signed)
Reason for Consult:PTA Referring Physician: er  Eric Osborne is an 28 y.o. male.  HPI: hx of 5 days of pain in throat on left side. Increasing problem. He had no had tonsillitis previous. He is still able to take fluids.   Past Medical History:  Diagnosis Date  . Depression   . Hypertension     Past Surgical History:  Procedure Laterality Date  . NO PAST SURGERIES      Family History  Problem Relation Age of Onset  . Depression Mother   . Hypertension Mother   . Hypertension Father   . Suicidality Neg Hx     Social History:  reports that he quit smoking about 8 years ago. He has never used smokeless tobacco. He reports that he does not drink alcohol or use drugs.  Allergies: No Known Allergies  Medications: I have reviewed the patient's current medications.  Results for orders placed or performed during the hospital encounter of 05/18/18 (from the past 48 hour(s))  CBC with Differential     Status: Abnormal   Collection Time: 05/18/18 11:13 AM  Result Value Ref Range   WBC 13.0 (H) 4.0 - 10.5 K/uL   RBC 4.74 4.22 - 5.81 MIL/uL   Hemoglobin 13.6 13.0 - 17.0 g/dL   HCT 43.5 68.6 - 16.8 %   MCV 85.0 80.0 - 100.0 fL   MCH 28.7 26.0 - 34.0 pg   MCHC 33.7 30.0 - 36.0 g/dL   RDW 37.2 90.2 - 11.1 %   Platelets 323 150 - 400 K/uL   nRBC 0.0 0.0 - 0.2 %   Neutrophils Relative % 74 %   Neutro Abs 9.6 (H) 1.7 - 7.7 K/uL   Lymphocytes Relative 17 %   Lymphs Abs 2.2 0.7 - 4.0 K/uL   Monocytes Relative 7 %   Monocytes Absolute 1.0 0.1 - 1.0 K/uL   Eosinophils Relative 1 %   Eosinophils Absolute 0.1 0.0 - 0.5 K/uL   Basophils Relative 0 %   Basophils Absolute 0.0 0.0 - 0.1 K/uL   Immature Granulocytes 1 %   Abs Immature Granulocytes 0.11 (H) 0.00 - 0.07 K/uL    Comment: Performed at [facility], [address], [place], [place] [address]  Basic metabolic panel     Status: Abnormal   Collection Time: 05/18/18 11:13 AM  Result Value Ref Range   Sodium 137 135 - 145  mmol/L   Potassium 4.0 3.5 - 5.1 mmol/L   Chloride 101 98 - 111 mmol/L   CO2 25 22 - 32 mmol/L   Glucose, Bld 107 (H) 70 - 99 mg/dL   BUN 15 6 - 20 mg/dL   Creatinine, Ser 0.22 0.61 - 1.24 mg/dL   Calcium 9.4 8.9 - 33.6 mg/dL   GFR calc non Af Amer >60 >60 mL/min   GFR calc Af Amer >60 >60 mL/min   Anion gap 11 5 - 15    Comment: Performed at [facility], [address], [place], [place] [address]    Ct Soft Tissue Neck W Contrast  Result Date: 05/18/2018 CLINICAL DATA:  Strep throat.  Throat swelling and pain EXAM: CT NECK WITH CONTRAST TECHNIQUE: Multidetector CT imaging of the neck was performed using the standard protocol following the bolus administration of intravenous contrast. CONTRAST:  27mL OMNIPAQUE IOHEXOL 300 MG/ML  SOLN COMPARISON:  None. FINDINGS: Pharynx and larynx: Large right peritonsillar fluid collection measuring 32 x 21 mm compatible with abscess. Surrounding soft tissue edema.  Epiglottis and larynx normal. Airway intact. Salivary glands: No inflammation, mass, or stone. Thyroid: Negative Lymph nodes: Reactive lymph nodes in the neck bilaterally. Largest unload is a 14 mm right level 2 node. Additional multiple subcentimeter nodes in the neck bilaterally. Vascular: Normal vascular enhancement Limited intracranial: Negative Visualized orbits: Negative Mastoids and visualized paranasal sinuses: Negative Skeleton: Negative Upper chest: Negative Other: None IMPRESSION: Large right peritonsillar abscess 32 x 31 mm. Reactive lymph nodes in the neck bilaterally. No airway compromise. Electronically Signed   By: Marlan Palauharles  Clark M.D.   On: 05/18/2018 13:03    Review of Systems  Constitutional: Negative.   HENT: Negative.   Eyes: Negative.   Skin: Negative.    Blood pressure 127/73, pulse 77, temperature 99 F (37.2 C), temperature source Oral, resp. rate 18, height 6' (1.829 m), weight 117.9 kg, SpO2 100 %. Physical Exam  Constitutional: He appears well-developed and  well-nourished.  HENT:  Head: Normocephalic and atraumatic.  Nose: Nose normal.  Eyes: Pupils are equal, round, and reactive to light. Conjunctivae are normal.  See note for mouth exam  Neck: Normal range of motion. Neck supple.    Assessment/Plan: PTA- discussed I/D with patient. Risks, benefits, and options discussed.  All questions answered and consent obtained. The oral cavity had no swelling of the tongue. He has some trismus. The right tonsil is very swollen and bulging. The area was sprayed with Cetacaine and injected with lidocaine 2 cc. An incision made in peritonsillar area and immediate pus encountered. hemstat placed with a gush of pus. He tolerated well minimal bleeding. He should be treated with Augmentin and some pain meds follow up in 2 weeks unless not back to normal in 72 hours or worsening symptoms.   Eric Osborne 05/18/2018, 2:42 PM

## 2018-05-20 LAB — I-STAT CREATININE, ED: Creatinine, Ser: 0.7 mg/dL (ref 0.61–1.24)

## 2018-09-18 ENCOUNTER — Other Ambulatory Visit: Payer: Self-pay

## 2018-09-18 DIAGNOSIS — Z20822 Contact with and (suspected) exposure to covid-19: Secondary | ICD-10-CM

## 2018-09-19 LAB — NOVEL CORONAVIRUS, NAA: SARS-CoV-2, NAA: NOT DETECTED

## 2019-04-12 ENCOUNTER — Ambulatory Visit: Payer: MEDICAID | Attending: Internal Medicine

## 2019-04-12 DIAGNOSIS — Z23 Encounter for immunization: Secondary | ICD-10-CM

## 2019-04-12 NOTE — Progress Notes (Signed)
   Covid-19 Vaccination Clinic  Name:  Eric Osborne    MRN: [id number] DOB: 1990/08/17  04/12/2019  [name] was observed post Covid-19 immunization for 15 minutes without incident. He was provided with Vaccine Information Sheet and instruction to access the V-Safe system.   [name] was instructed to call 911 with any severe reactions post vaccine: Marland Kitchen Difficulty breathing  . Swelling of face and throat  . A fast heartbeat  . A bad rash all over body  . Dizziness and weakness   Immunizations Administered    Name Date Dose VIS Date Route   Pfizer COVID-19 Vaccine 04/12/2019  4:38 PM 0.3 mL 01/17/2019 Intramuscular   Manufacturer: [employer], [employer]   Lot: [id number]   NDC: [address]-8

## 2019-05-13 ENCOUNTER — Ambulatory Visit: Payer: Self-pay

## 2019-05-13 ENCOUNTER — Ambulatory Visit: Payer: MEDICAID | Attending: Internal Medicine

## 2019-05-13 DIAGNOSIS — Z23 Encounter for immunization: Secondary | ICD-10-CM

## 2019-05-13 NOTE — Progress Notes (Signed)
   Covid-19 Vaccination Clinic  Name:  Eric Osborne    MRN: [id number] DOB: 08/20/1990  05/13/2019  [name] was observed post Covid-19 immunization for 15 minutes without incident. He was provided with Vaccine Information Sheet and instruction to access the V-Safe system.   [name] was instructed to call 911 with any severe reactions post vaccine: Marland Kitchen Difficulty breathing  . Swelling of face and throat  . A fast heartbeat  . A bad rash all over body  . Dizziness and weakness   Immunizations Administered    Name Date Dose VIS Date Route   Pfizer COVID-19 Vaccine 05/13/2019  9:13 AM 0.3 mL 01/17/2019 Intramuscular   Manufacturer: [employer], [employer]   Lot: [id number]   NDC: [address]-1

## 2019-06-03 ENCOUNTER — Ambulatory Visit: Payer: Self-pay

## 2019-10-21 ENCOUNTER — Telehealth: Payer: MEDICAID | Admitting: Physician Assistant

## 2019-10-21 DIAGNOSIS — R21 Rash and other nonspecific skin eruption: Secondary | ICD-10-CM

## 2019-10-21 MED ORDER — FLUCONAZOLE 150 MG PO TABS: ORAL_TABLET | ORAL | 0 refills | Status: DC

## 2019-10-21 NOTE — Progress Notes (Signed)
E Visit for Rash  We are sorry that you are not feeling well. Here is how we plan to help!  I have prescribed a medication called Fluconazole. You will need to take one tablet on the day you fill the prescription. If you continue to have symptoms you can take the second tablet in 3 days. If your symptoms persist after taking the second tablet then you will need to be seen by your healthcare provider to evaluate for other causes if your symptoms.  HOME CARE:   Take cool showers and avoid direct sunlight.  Apply cool compress or wet dressings.  Take a bath in an oatmeal bath.  Sprinkle content of one Aveeno packet under running faucet with comfortably warm water.  Bathe for 15-20 minutes, 1-2 times daily.  Pat dry with a towel. Do not rub the rash.  Use hydrocortisone cream.  Take an antihistamine like Benadryl for widespread rashes that itch.  The adult dose of Benadryl is 25-50 mg by mouth 4 times daily.  Caution:  This type of medication may cause sleepiness.  Do not drink alcohol, drive, or operate dangerous machinery while taking antihistamines.  Do not take these medications if you have prostate enlargement.  Read package instructions thoroughly on all medications that you take.  GET HELP RIGHT AWAY IF:   Symptoms don't go away after treatment.  Severe itching that persists.  If you rash spreads or swells.  If you rash begins to smell.  If it blisters and opens or develops a yellow-brown crust.  You develop a fever.  You have a sore throat.  You become short of breath.  MAKE SURE YOU:  Understand these instructions. Will watch your condition. Will get help right away if you are not doing well or get worse.  Thank you for choosing an e-visit. Your e-visit answers were reviewed by a board certified advanced clinical practitioner to complete your personal care plan. Depending upon the condition, your plan could have included both over the counter or prescription  medications. Please review your pharmacy choice. Be sure that the pharmacy you have chosen is open so that you can pick up your prescription now.  If there is a problem you may message your provider in MyChart to have the prescription routed to another pharmacy. Your safety is important to [place]. If you have drug allergies check your prescription carefully.  For the next 24 hours, you can use MyChart to ask questions about today's visit, request a non-urgent call back, or ask for a work or school excuse from your e-visit provider. You will get an email in the next two days asking about your experience. I hope that your e-visit has been valuable and will speed your recovery.   Approximately 5 minutes was spent documenting and reviewing patient's chart.

## 2020-07-30 ENCOUNTER — Ambulatory Visit (HOSPITAL_COMMUNITY)
Admission: EM | Admit: 2020-07-30 | Discharge: 2020-07-30 | Disposition: A | Discharge status: Home / Self Care | Payer: Self-pay | Attending: Physician Assistant | Admitting: Physician Assistant

## 2020-07-30 ENCOUNTER — Encounter (HOSPITAL_COMMUNITY): Payer: Self-pay | Admitting: Emergency Medicine

## 2020-07-30 DIAGNOSIS — T63441A Toxic effect of venom of bees, accidental (unintentional), initial encounter: Secondary | ICD-10-CM

## 2020-07-30 DIAGNOSIS — L03116 Cellulitis of left lower limb: Secondary | ICD-10-CM

## 2020-07-30 DIAGNOSIS — W57XXXA Bitten or stung by nonvenomous insect and other nonvenomous arthropods, initial encounter: Secondary | ICD-10-CM

## 2020-07-30 MED ORDER — DOXYCYCLINE HYCLATE 100 MG PO CAPS: 100.0000 mg | ORAL_CAPSULE | Freq: Two times a day (BID) | ORAL | 0 refills | Status: DC

## 2020-07-30 NOTE — Discharge Instructions (Addendum)
Return if any problems.

## 2020-07-30 NOTE — ED Triage Notes (Signed)
Pt is present today with swelling and pain on the left lower leg. Pt states that he was stung by bee Wednesday around 3pm. Pt states that he tried to squeeze the bee sting out and just saw puss and blood.

## 2020-07-30 NOTE — ED Provider Notes (Signed)
[facility]    CSN: [id number] Arrival date & time: 07/30/20  0807      History   Chief Complaint Chief Complaint  Patient presents with   Insect Bite    HPI Eric Osborne is a 30 y.o. male.   Pt complains of a bee sting in his left lower leg.  Pt reports he was stung 2 days ago.  Pt reports increased redness and pain  The history is provided by the patient. No language interpreter was used.   Past Medical History:  Diagnosis Date   Depression    Hypertension     Patient Active Problem List   Diagnosis Date Noted   Insomnia 12/11/2013   Major depression 06/20/2013   Intentional acetaminophen overdose (HCC) 06/20/2013   Suicide attempt (HCC) 06/20/2013   MDD (major depressive disorder) 06/20/2013    Past Surgical History:  Procedure Laterality Date   NO PAST SURGERIES         Home Medications    Prior to Admission medications   Medication Sig Start Date End Date Taking? Authorizing Provider  amoxicillin-clavulanate (AUGMENTIN) 875-125 MG tablet Take 1 tablet by mouth every 12 (twelve) hours. 05/18/18   Fawze, Mina A, PA-C  citalopram (CELEXA) 20 MG tablet Take 1 tablet (20 mg total) by mouth daily. 12/11/13 12/11/14  Oletta Darter, MD  fluconazole (DIFLUCAN) 150 MG tablet Take one tablet on the day you fill the prescription. If you continue to have symptoms then take the second tablet in 3 days. 10/21/19   Couture, Cortni S, PA-C  fluticasone (FLONASE) 50 MCG/ACT nasal spray Place 2 sprays into both nostrils daily. 05/13/18   Domenick Gong, MD  HYDROcodone-acetaminophen (NORCO/VICODIN) 5-325 MG tablet Take 2 tablets by mouth every 6 (six) hours as needed for severe pain. 05/18/18   Luevenia Maxin, Mina A, PA-C  hydrOXYzine (VISTARIL) 25 MG capsule Take 1-2 tabs po qHS prn insomnia 12/11/13   Oletta Darter, MD  ibuprofen (ADVIL,MOTRIN) 600 MG tablet Take 1 tablet (600 mg total) by mouth every 6 (six) hours as needed. 05/13/18   Domenick Gong, MD    Family  History Family History  Problem Relation Age of Onset   Depression Mother    Hypertension Mother    Hypertension Father    Suicidality Neg Hx     Social History Social History   Tobacco Use   Smoking status: Former    Pack years: 0.00    Types: Cigarettes    Quit date: 08/07/2009    Years since quitting: 10.9   Smokeless tobacco: Never  Substance Use Topics   Alcohol use: No    Comment: 1-2 beers a week   Drug use: No     Allergies   Patient has no known allergies.   Review of Systems Review of Systems  All other systems reviewed and are negative.   Physical Exam Triage Vital Signs ED Triage Vitals [07/30/20 0827]  Enc Vitals Group     BP (!) 115/100     Pulse Rate 76     Resp 18     Temp 98.7 F (37.1 C)     Temp Source Oral     SpO2 99 %     Weight      Height      Head Circumference      Peak Flow      Pain Score 8     Pain Loc      Pain Edu?  Excl. in GC?    No data found.  Updated Vital Signs BP (!) 115/100   Pulse 76   Temp 98.7 F (37.1 C) (Oral)   Resp 18   SpO2 99%   Visual Acuity Right Eye Distance:   Left Eye Distance:   Bilateral Distance:    Right Eye Near:   Left Eye Near:    Bilateral Near:     Physical Exam Vitals and nursing note reviewed.  Constitutional:      Appearance: He is well-developed.  HENT:     Head: Normocephalic and atraumatic.  Eyes:     Conjunctiva/sclera: Conjunctivae normal.  Cardiovascular:     Rate and Rhythm: Normal rate.     Heart sounds: No murmur heard. Pulmonary:     Effort: Pulmonary effort is normal. No respiratory distress.  Abdominal:     General: There is no distension.     Palpations: Abdomen is soft.     Tenderness: There is no abdominal tenderness.  Musculoskeletal:        General: Normal range of motion.     Cervical back: Normal range of motion.     Comments: 14cm area of erythema left lower leg, swollen, nv nad ns intact   Skin:    General: Skin is warm and dry.   Neurological:     Mental Status: He is alert and oriented to person, place, and time.     UC Treatments / Results  Labs (all labs ordered are listed, but only abnormal results are displayed) Labs Reviewed - No data to display  EKG   Radiology No results found.  Procedures Procedures (including critical care time)  Medications Ordered in UC Medications - No data to display  Initial Impression / Assessment and Plan / UC Course  I have reviewed the triage vital signs and the nursing notes.  Pertinent labs & imaging results that were available during my care of the patient were reviewed by me and considered in my medical decision making (see chart for details).    MDM:  Pt given rx for doxycycline Final Clinical Impressions(s) / UC Diagnoses   Final diagnoses:  Insect bite of left lower leg, initial encounter  Cellulitis of left lower extremity     Discharge Instructions      Return if any problems.     ED Prescriptions   None    PDMP not reviewed this encounter.   Elson Areas, [place] 07/30/20 0900

## 2020-08-14 DIAGNOSIS — J069 Acute upper respiratory infection, unspecified: Secondary | ICD-10-CM | POA: Diagnosis not present

## 2020-08-14 DIAGNOSIS — J029 Acute pharyngitis, unspecified: Secondary | ICD-10-CM | POA: Diagnosis not present

## 2020-08-14 DIAGNOSIS — R051 Acute cough: Secondary | ICD-10-CM | POA: Diagnosis not present

## 2020-08-14 DIAGNOSIS — Z20822 Contact with and (suspected) exposure to covid-19: Secondary | ICD-10-CM | POA: Diagnosis not present

## 2020-10-22 DIAGNOSIS — U071 COVID-19: Secondary | ICD-10-CM | POA: Diagnosis not present

## 2020-11-01 ENCOUNTER — Ambulatory Visit: Payer: Self-pay | Admitting: Emergency Medicine

## 2020-11-01 ENCOUNTER — Other Ambulatory Visit: Payer: Self-pay

## 2020-11-02 ENCOUNTER — Ambulatory Visit: Payer: 59 | Admitting: Emergency Medicine

## 2020-11-02 ENCOUNTER — Encounter: Payer: Self-pay | Admitting: Emergency Medicine

## 2020-11-02 VITALS — BP 132/82 | HR 75 | Temp 98.2°F | Ht 72.0 in | Wt 267.0 lb

## 2020-11-02 DIAGNOSIS — Z13228 Encounter for screening for other metabolic disorders: Secondary | ICD-10-CM | POA: Diagnosis not present

## 2020-11-02 DIAGNOSIS — Z0001 Encounter for general adult medical examination with abnormal findings: Secondary | ICD-10-CM

## 2020-11-02 DIAGNOSIS — Z130 Encounter for screening for diseases of the blood and blood-forming organs and certain disorders involving the immune mechanism: Secondary | ICD-10-CM | POA: Diagnosis not present

## 2020-11-02 DIAGNOSIS — Z1329 Encounter for screening for other suspected endocrine disorder: Secondary | ICD-10-CM

## 2020-11-02 DIAGNOSIS — Z23 Encounter for immunization: Secondary | ICD-10-CM | POA: Diagnosis not present

## 2020-11-02 DIAGNOSIS — Z13220 Encounter for screening for lipoid disorders: Secondary | ICD-10-CM | POA: Diagnosis not present

## 2020-11-02 DIAGNOSIS — K2100 Gastro-esophageal reflux disease with esophagitis, without bleeding: Secondary | ICD-10-CM

## 2020-11-02 DIAGNOSIS — Z114 Encounter for screening for human immunodeficiency virus [HIV]: Secondary | ICD-10-CM | POA: Diagnosis not present

## 2020-11-02 DIAGNOSIS — Z1159 Encounter for screening for other viral diseases: Secondary | ICD-10-CM

## 2020-11-02 LAB — CBC WITH DIFFERENTIAL/PLATELET
Basophils Absolute: 0 10*3/uL (ref 0.0–0.1)
Basophils Relative: 0.5 % (ref 0.0–3.0)
Eosinophils Absolute: 0.1 10*3/uL (ref 0.0–0.7)
Eosinophils Relative: 1.5 % (ref 0.0–5.0)
HCT: 40.9 % (ref 39.0–52.0)
Hemoglobin: 14.2 g/dL (ref 13.0–17.0)
Lymphocytes Relative: 33.7 % (ref 12.0–46.0)
Lymphs Abs: 1.8 10*3/uL (ref 0.7–4.0)
MCHC: 34.7 g/dL (ref 30.0–36.0)
MCV: 84.5 fl (ref 78.0–100.0)
Monocytes Absolute: 0.5 10*3/uL (ref 0.1–1.0)
Monocytes Relative: 8.7 % (ref 3.0–12.0)
Neutro Abs: 2.9 10*3/uL (ref 1.4–7.7)
Neutrophils Relative %: 55.6 % (ref 43.0–77.0)
Platelets: 254 10*3/uL (ref 150.0–400.0)
RBC: 4.84 Mil/uL (ref 4.22–5.81)
RDW: 13.5 % (ref 11.5–15.5)
WBC: 5.3 10*3/uL (ref 4.0–10.5)

## 2020-11-02 LAB — COMPREHENSIVE METABOLIC PANEL
ALT: 25 U/L (ref 0–53)
AST: 16 U/L (ref 0–37)
Albumin: 4.1 g/dL (ref 3.5–5.2)
Alkaline Phosphatase: 85 U/L (ref 39–117)
BUN: 16 mg/dL (ref 6–23)
CO2: 25 mEq/L (ref 19–32)
Calcium: 9.1 mg/dL (ref 8.4–10.5)
Chloride: 104 mEq/L (ref 96–112)
Creatinine, Ser: 0.81 mg/dL (ref 0.40–1.50)
GFR: 116.85 mL/min (ref >60.00)
Glucose, Bld: 114 mg/dL — ABNORMAL HIGH (ref 70–99)
Potassium: 3.6 mEq/L (ref 3.5–5.1)
Sodium: 137 mEq/L (ref 135–145)
Total Bilirubin: 0.4 mg/dL (ref 0.2–1.2)
Total Protein: 6.9 g/dL (ref 6.0–8.3)

## 2020-11-02 LAB — LIPID PANEL
Cholesterol: 183 mg/dL (ref 0–200)
HDL: 28.5 mg/dL — ABNORMAL LOW (ref >39.00)
Total CHOL/HDL Ratio: 6
Triglycerides: 790 mg/dL — ABNORMAL HIGH (ref 0.0–149.0)

## 2020-11-02 LAB — LDL CHOLESTEROL, DIRECT: Direct LDL: 55 mg/dL

## 2020-11-02 LAB — HEMOGLOBIN A1C: Hgb A1c MFr Bld: 5.4 % (ref 4.6–6.5)

## 2020-11-02 MED ORDER — PANTOPRAZOLE SODIUM 40 MG PO TBEC
40.0000 mg | DELAYED_RELEASE_TABLET | Freq: Every day | ORAL | 3 refills | Status: DC
  Filled 2021-03-03: qty 30, 30d supply, fill #0
  Filled 2021-03-11: qty 90, 90d supply, fill #0

## 2020-11-02 NOTE — Progress Notes (Signed)
Eric Osborne 30 y.o.   Chief Complaint  Patient presents with   Annual Exam    HISTORY OF PRESENT ILLNESS: This is a 30 y.o. male here for annual exam. First visit to this office wants to establish care with me. Past medical history of GERD symptoms for the past 2 to 3 years.  Nexium over-the-counter medication occasionally helps. No other chronic medical problems. Otherwise healthy with a healthy lifestyle.   HPI   Prior to Admission medications   Not on File    No Known Allergies  Patient Active Problem List   Diagnosis Date Noted   Insomnia 12/11/2013   Major depression 06/20/2013   Suicide attempt (HCC) 06/20/2013   MDD (major depressive disorder) 06/20/2013    Past Medical History:  Diagnosis Date   Allergy    Depression    Hypertension     Past Surgical History:  Procedure Laterality Date   NO PAST SURGERIES      Social History   Socioeconomic History   Marital status: Married    Spouse name: Not on file   Number of children: Not on file   Years of education: Not on file   Highest education level: Not on file  Occupational History   Not on file  Tobacco Use   Smoking status: Former    Types: Cigarettes    Quit date: 08/07/2009    Years since quitting: 11.2   Smokeless tobacco: Never  Substance and Sexual Activity   Alcohol use: No    Comment: 1-2 beers a week   Drug use: No   Sexual activity: Not on file  Other Topics Concern   Not on file  Social History Narrative   Not on file   Social Determinants of Health   Financial Resource Strain: Not on file  Food Insecurity: Not on file  Transportation Needs: Not on file  Physical Activity: Not on file  Stress: Not on file  Social Connections: Not on file  Intimate Partner Violence: Not on file    Family History  Problem Relation Age of Onset   Depression Mother    Hypertension Mother    Hypertension Father    Suicidality Neg Hx      Review of Systems  Constitutional:  Negative.  Negative for chills and fever.  HENT:  Negative for hearing loss and sore throat.        Occasional sinus infections  Eyes: Negative.   Respiratory: Negative.  Negative for cough and shortness of breath.   Cardiovascular: Negative.  Negative for chest pain and palpitations.  Gastrointestinal:  Positive for heartburn. Negative for abdominal pain, blood in stool, diarrhea, melena, nausea and vomiting.  Genitourinary: Negative.  Negative for dysuria.  Musculoskeletal: Negative.   Skin: Negative.  Negative for rash.  Neurological: Negative.  Negative for dizziness and headaches.  All other systems reviewed and are negative.  Today's Vitals   11/02/20 0819  BP: 132/82  Pulse: 75  Temp: 98.2 F (36.8 C)  TempSrc: Oral  SpO2: 98%  Weight: 267 lb (121.1 kg)  Height: 6' (1.829 m)   Body mass index is 36.21 kg/m.  Physical Exam Vitals reviewed.  Constitutional:      Appearance: Normal appearance.  HENT:     Head: Normocephalic.     Right Ear: Tympanic membrane, ear canal and external ear normal.     Left Ear: Tympanic membrane, ear canal and external ear normal.     Mouth/Throat:     Mouth:  Mucous membranes are moist.     Pharynx: Oropharynx is clear.  Eyes:     Extraocular Movements: Extraocular movements intact.     Conjunctiva/sclera: Conjunctivae normal.     Pupils: Pupils are equal, round, and reactive to light.  Cardiovascular:     Rate and Rhythm: Normal rate and regular rhythm.     Pulses: Normal pulses.     Heart sounds: Normal heart sounds.  Pulmonary:     Effort: Pulmonary effort is normal.     Breath sounds: Normal breath sounds.  Abdominal:     General: There is no distension.     Palpations: Abdomen is soft.     Tenderness: There is no abdominal tenderness.  Musculoskeletal:        General: Normal range of motion.     Cervical back: Normal range of motion and neck supple. No tenderness.  Lymphadenopathy:     Cervical: No cervical adenopathy.   Skin:    General: Skin is warm and dry.     Capillary Refill: Capillary refill takes less than 2 seconds.  Neurological:     General: No focal deficit present.     Mental Status: He is alert and oriented to person, place, and time.  Psychiatric:        Mood and Affect: Mood normal.        Behavior: Behavior normal.     ASSESSMENT & PLAN: Acute problem addressed today: GERD.  Started on pantoprazole 40 mg daily. Referred to GI for upper endoscopy.  Raynold was seen today for annual exam.  Diagnoses and all orders for this visit:  Encounter for general adult medical examination with abnormal findings  Need for influenza vaccination -     Flu Vaccine QUAD 29mo+IM (Fluarix, Fluzone & Alfiuria Quad PF)  Gastroesophageal reflux disease with esophagitis without hemorrhage -     pantoprazole (PROTONIX) 40 MG tablet; Take 1 tablet (40 mg total) by mouth daily. -     Ambulatory referral to Gastroenterology  Need for hepatitis C screening test -     Hepatitis C antibody screen  Screening for HIV (human immunodeficiency virus) -     HIV antibody  Screening for deficiency anemia -     CBC with Differential  Screening for lipoid disorders -     Lipid panel  Screening for endocrine, metabolic and immunity disorder -     Comprehensive metabolic panel -     Hemoglobin A1c  Other orders -     LDL cholesterol, direct  Modifiable risk factors discussed with patient. Anticipatory guidance according to age provided. The following topics were also discussed: Social Determinants of Health Smoking non-smoker Diet and nutrition and need to decrease amount of daily carbohydrate intake Benefits of exercise Cancer family history review Vaccinations recommendations Cardiovascular risk assessment Mental health including depression and anxiety Diagnosis of GERD and treatment Fall and accident prevention   Patient Instructions  Health Maintenance, Male Adopting a healthy lifestyle  and getting preventive care are important in promoting health and wellness. Ask your health care provider about: The right schedule for you to have regular tests and exams. Things you can do on your own to prevent diseases and keep yourself healthy. What should I know about diet, weight, and exercise? Eat a healthy diet  Eat a diet that includes plenty of vegetables, fruits, low-fat dairy products, and lean protein. Do not eat a lot of foods that are high in solid fats, added sugars, or sodium. Maintain a  healthy weight Body mass index (BMI) is a measurement that can be used to identify possible weight problems. It estimates body fat based on height and weight. Your health care provider can help determine your BMI and help you achieve or maintain a healthy weight. Get regular exercise Get regular exercise. This is one of the most important things you can do for your health. Most adults should: Exercise for at least 150 minutes each week. The exercise should increase your heart rate and make you sweat (moderate-intensity exercise). Do strengthening exercises at least twice a week. This is in addition to the moderate-intensity exercise. Spend less time sitting. Even light physical activity can be beneficial. Watch cholesterol and blood lipids Have your blood tested for lipids and cholesterol at 32 years of age, then have this test every 5 years. You may need to have your cholesterol levels checked more often if: Your lipid or cholesterol levels are high. You are older than 32 years of age. You are at high risk for heart disease. What should I know about cancer screening? Many types of cancers can be detected early and may often be prevented. Depending on your health history and family history, you may need to have cancer screening at various ages. This may include screening for: Colorectal cancer. Prostate cancer. Skin cancer. Lung cancer. What should I know about heart disease, diabetes,  and high blood pressure? Blood pressure and heart disease High blood pressure causes heart disease and increases the risk of stroke. This is more likely to develop in people who have high blood pressure readings, are of African descent, or are overweight. Talk with your health care provider about your target blood pressure readings. Have your blood pressure checked: Every 3-5 years if you are 65-51 years of age. Every year if you are 33 years old or older. If you are between the ages of 9 and 23 and are a current or former smoker, ask your health care provider if you should have a one-time screening for abdominal aortic aneurysm (AAA). Diabetes Have regular diabetes screenings. This checks your fasting blood sugar level. Have the screening done: Once every three years after age 50 if you are at a normal weight and have a low risk for diabetes. More often and at a younger age if you are overweight or have a high risk for diabetes. What should I know about preventing infection? Hepatitis B If you have a higher risk for hepatitis B, you should be screened for this virus. Talk with your health care provider to find out if you are at risk for hepatitis B infection. Hepatitis C Blood testing is recommended for: Everyone born from [birth year] through 1967. Anyone with known risk factors for hepatitis C. Sexually transmitted infections (STIs) You should be screened each year for STIs, including gonorrhea and chlamydia, if: You are sexually active and are younger than 32 years of age. You are older than 32 years of age and your health care provider tells you that you are at risk for this type of infection. Your sexual activity has changed since you were last screened, and you are at increased risk for chlamydia or gonorrhea. Ask your health care provider if you are at risk. Ask your health care provider about whether you are at high risk for HIV. Your health care provider may recommend a prescription  medicine to help prevent HIV infection. If you choose to take medicine to prevent HIV, you should first get tested for HIV. You should then  be tested every 3 months for as long as you are taking the medicine. Follow these instructions at home: Lifestyle Do not use any products that contain nicotine or tobacco, such as cigarettes, e-cigarettes, and chewing tobacco. If you need help quitting, ask your health care provider. Do not use street drugs. Do not share needles. Ask your health care provider for help if you need support or information about quitting drugs. Alcohol use Do not drink alcohol if your health care provider tells you not to drink. If you drink alcohol: Limit how much you have to 0-2 drinks a day. Be aware of how much alcohol is in your drink. In the [place] one drink equals one 12 oz bottle of beer (355 mL), one 5 oz glass of wine (148 mL), or one 1 oz glass of hard liquor (44 mL). General instructions Schedule regular health, dental, and eye exams. Stay current with your vaccines. Tell your health care provider if: You often feel depressed. You have ever been abused or do not feel safe at home. Summary Adopting a healthy lifestyle and getting preventive care are important in promoting health and wellness. Follow your health care provider's instructions about healthy diet, exercising, and getting tested or screened for diseases. Follow your health care provider's instructions on monitoring your cholesterol and blood pressure. This information is not intended to replace advice given to you by your health care provider. Make sure you discuss any questions you have with your health care provider. Document Revised: 04/02/2020 Document Reviewed: 01/16/2018 Elsevier Patient Education  2022 [employer]    Edwina Barth, MD [facility]

## 2020-11-02 NOTE — Patient Instructions (Signed)
Health Maintenance, Male Adopting a healthy lifestyle and getting preventive care are important in promoting health and wellness. Ask your health care provider about: The right schedule for you to have regular tests and exams. Things you can do on your own to prevent diseases and keep yourself healthy. What should I know about diet, weight, and exercise? Eat a healthy diet  Eat a diet that includes plenty of vegetables, fruits, low-fat dairy products, and lean protein. Do not eat a lot of foods that are high in solid fats, added sugars, or sodium. Maintain a healthy weight Body mass index (BMI) is a measurement that can be used to identify possible weight problems. It estimates body fat based on height and weight. Your health care provider can help determine your BMI and help you achieve or maintain a healthy weight. Get regular exercise Get regular exercise. This is one of the most important things you can do for your health. Most adults should: Exercise for at least 150 minutes each week. The exercise should increase your heart rate and make you sweat (moderate-intensity exercise). Do strengthening exercises at least twice a week. This is in addition to the moderate-intensity exercise. Spend less time sitting. Even light physical activity can be beneficial. Watch cholesterol and blood lipids Have your blood tested for lipids and cholesterol at 32 years of age, then have this test every 5 years. You may need to have your cholesterol levels checked more often if: Your lipid or cholesterol levels are high. You are older than 32 years of age. You are at high risk for heart disease. What should I know about cancer screening? Many types of cancers can be detected early and may often be prevented. Depending on your health history and family history, you may need to have cancer screening at various ages. This may include screening for: Colorectal cancer. Prostate cancer. Skin cancer. Lung  cancer. What should I know about heart disease, diabetes, and high blood pressure? Blood pressure and heart disease High blood pressure causes heart disease and increases the risk of stroke. This is more likely to develop in people who have high blood pressure readings, are of African descent, or are overweight. Talk with your health care provider about your target blood pressure readings. Have your blood pressure checked: Every 3-5 years if you are 18-39 years of age. Every year if you are 40 years old or older. If you are between the ages of 65 and 75 and are a current or former smoker, ask your health care provider if you should have a one-time screening for abdominal aortic aneurysm (AAA). Diabetes Have regular diabetes screenings. This checks your fasting blood sugar level. Have the screening done: Once every three years after age 45 if you are at a normal weight and have a low risk for diabetes. More often and at a younger age if you are overweight or have a high risk for diabetes. What should I know about preventing infection? Hepatitis B If you have a higher risk for hepatitis B, you should be screened for this virus. Talk with your health care provider to find out if you are at risk for hepatitis B infection. Hepatitis C Blood testing is recommended for: Everyone born from 1945 through 1965. Anyone with known risk factors for hepatitis C. Sexually transmitted infections (STIs) You should be screened each year for STIs, including gonorrhea and chlamydia, if: You are sexually active and are younger than 32 years of age. You are older than 32 years   of age and your health care provider tells you that you are at risk for this type of infection. Your sexual activity has changed since you were last screened, and you are at increased risk for chlamydia or gonorrhea. Ask your health care provider if you are at risk. Ask your health care provider about whether you are at high risk for HIV.  Your health care provider may recommend a prescription medicine to help prevent HIV infection. If you choose to take medicine to prevent HIV, you should first get tested for HIV. You should then be tested every 3 months for as long as you are taking the medicine. Follow these instructions at home: Lifestyle Do not use any products that contain nicotine or tobacco, such as cigarettes, e-cigarettes, and chewing tobacco. If you need help quitting, ask your health care provider. Do not use street drugs. Do not share needles. Ask your health care provider for help if you need support or information about quitting drugs. Alcohol use Do not drink alcohol if your health care provider tells you not to drink. If you drink alcohol: Limit how much you have to 0-2 drinks a day. Be aware of how much alcohol is in your drink. In the [place] one drink equals one 12 oz bottle of beer (355 mL), one 5 oz glass of wine (148 mL), or one 1 oz glass of hard liquor (44 mL). General instructions Schedule regular health, dental, and eye exams. Stay current with your vaccines. Tell your health care provider if: You often feel depressed. You have ever been abused or do not feel safe at home. Summary Adopting a healthy lifestyle and getting preventive care are important in promoting health and wellness. Follow your health care provider's instructions about healthy diet, exercising, and getting tested or screened for diseases. Follow your health care provider's instructions on monitoring your cholesterol and blood pressure. This information is not intended to replace advice given to you by your health care provider. Make sure you discuss any questions you have with your health care provider. Document Revised: 04/02/2020 Document Reviewed: 01/16/2018 Elsevier Patient Education  2022 Elsevier Inc.  

## 2020-11-03 LAB — HIV ANTIBODY (ROUTINE TESTING W REFLEX): HIV 1&2 Ab, 4th Generation: NONREACTIVE

## 2020-11-03 LAB — HEPATITIS C ANTIBODY
Hepatitis C Ab: NONREACTIVE
SIGNAL TO CUT-OFF: 0.01 (ref <1.00)

## 2020-12-16 ENCOUNTER — Encounter: Payer: Self-pay | Admitting: Emergency Medicine

## 2020-12-21 ENCOUNTER — Ambulatory Visit: Payer: Self-pay

## 2020-12-21 ENCOUNTER — Other Ambulatory Visit: Payer: Self-pay

## 2020-12-21 ENCOUNTER — Ambulatory Visit (INDEPENDENT_AMBULATORY_CARE_PROVIDER_SITE_OTHER): Payer: 59 | Admitting: Family Medicine

## 2020-12-21 VITALS — BP 144/96 | HR 82 | Ht 72.0 in | Wt 265.2 lb

## 2020-12-21 DIAGNOSIS — M25562 Pain in left knee: Secondary | ICD-10-CM

## 2020-12-21 DIAGNOSIS — M7042 Prepatellar bursitis, left knee: Secondary | ICD-10-CM | POA: Diagnosis not present

## 2020-12-21 DIAGNOSIS — L739 Follicular disorder, unspecified: Secondary | ICD-10-CM | POA: Diagnosis not present

## 2020-12-21 MED ORDER — DOXYCYCLINE HYCLATE 100 MG PO TABS: 100.0000 mg | ORAL_TABLET | Freq: Two times a day (BID) | ORAL | 0 refills | Status: AC

## 2020-12-21 NOTE — Progress Notes (Signed)
   I, Philbert Riser, LAT, ATC acting as a scribe for Clementeen Graham, MD.  Subjective:    CC: L knee pain  HPI: Pt is a 31 y/o male c/o L knee pain x 1 week. 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 Pt locates pain to the anterior aspect of the L knee, but only when putting pressure over it. Pt notes an indentation from his sock and a red mark on the anterior-lateral aspect of his L lower leg.  L knee swelling: yes- into lower leg Mechanical symptoms: no Aggravates: pressure Treatments tried: ice, wearing knee pads while working, IBU  Dx imaging: 05/04/16 L knee XR  Pertinent review of Systems: No fevers or chills  Relevant historical information: Installs tile floors.  Acid reflux takes pantoprazole.   Objective:    Vitals:   12/21/20 1533  BP: (!) 144/96  Pulse: 82  SpO2: 98%   General: Well Developed, well nourished, and in no acute distress.   MSK: Left knee callus overlying patella tendon with swelling. Tender palpation overlying patella. 1+ pitting edema left lower leg. Small area of nickel sized folliculitis medial calf minimally tender. Knee motion intact and nonpainful. Strength is intact.   Lab and Radiology Results  Diagnostic Limited MSK Ultrasound of: Left knee Patellar tendon intact normal. Superficial to patellar tendon area of soft tissue with hypoechoic fluid tracking consistent with chronic prepatellar bursitis with acute inflammation. Distal to the knee mild edema present overlying tibia. Impression: Prepatellar bursitis and mild lower extremity edema.    Impression and Recommendations:    Assessment and Plan: 30 y.o. male with left knee anterior knee pain due to prepatellar bursitis.  Patient chronically has some anterior knee inflammation due to his occupation.  He purchased some kneepads and is starting to use them now which probably will help long-term.  I think his lower extremity edema is due to the  inflammation in the prepatellar space that has effectively leaked down into his lower leg which should resolve on its own.  As for the prepatellar bursitis we will treat with topical Voltaren gel and compression and avoiding further irritation if possible.  Additionally has a small area of folliculitis which we will treat with oral doxycycline.  Recheck back as needed.Marland Kitchen  PDMP not reviewed this encounter. Orders Placed This Encounter  Procedures   [place] LIMITED JOINT SPACE STRUCTURES LOW LEFT(NO LINKED CHARGES)    Standing Status:   Future    Number of Occurrences:   1    Standing Expiration Date:   06/20/2021    Order Specific Question:   Reason for Exam (SYMPTOM  OR DIAGNOSIS REQUIRED)    Answer:   left knee pain    Order Specific Question:   Preferred imaging location?    Answer:   [facility] ordered this encounter  Medications   doxycycline (VIBRA-TABS) 100 MG tablet    Sig: Take 1 tablet (100 mg total) by mouth 2 (two) times daily for 7 days.    Dispense:  14 tablet    Refill:  0    Discussed warning signs or symptoms. Please see discharge instructions. Patient expresses understanding.   The above documentation has been reviewed and is accurate and complete Clementeen Graham, M.D.

## 2020-12-21 NOTE — Patient Instructions (Addendum)
Thank you for coming in today.   Continue wearing your knee pads  Please use Voltaren gel (Generic Diclofenac Gel) up to 4x daily for pain as needed.  This is available over-the-counter as both the name brand Voltaren gel and the generic diclofenac gel.   I recommend you obtained a compression sleeve to help with your joint problems. There are many options on the market however I recommend obtaining a full knee Body Helix compression sleeve.  You can find information (including how to appropriate measure yourself for sizing) can be found at [email or web address].ch.  Many of these products are health savings account (HSA) eligible.  You can use the compression sleeve at any time throughout the day but is most important to use while being active as well as for 2 hours post-activity.   It is appropriate to ice following activity with the compression sleeve in place.   Recheck back as needed   Prepatellar Bursitis Prepatellar bursitis is inflammation of the prepatellar bursa, which is a fluid-filled sac that cushions the kneecap (patella). Prepatellar bursitis happens when fluid builds up in this sac and causes it to swell. The condition causes knee pain. What are the causes? This condition may be caused by: Constant pressure on the knees from kneeling. A hit to the knee. Falling on the knee. Infection from bacteria. Moving the knee often in a forceful way. What increases the risk? You are more likely to develop this condition if: You play sports that have a high risk of falling on the knee or being hit on the knee. These include football, wrestling, basketball, or soccer. You do work in which you kneel for long periods of time, such as roofing, plumbing, or gardening. You have another inflammatory condition, such as gout or rheumatoid arthritis. What are the signs or symptoms? The most common symptom of this condition is knee pain that gets better with rest. Other symptoms include: Swelling on  the front of the kneecap. Warmth in the knee. Tenderness with activity. Redness in the knee. Inability to bend the knee or to kneel. How is this diagnosed? This condition is diagnosed based on: A physical exam. Your health care provider will compare your knees and check for tenderness and pain while moving your knee. Your medical history. Tests to check for infection. These may include blood tests and tests on the fluid in the bursa. Imaging tests, such as X-ray, MRI, or ultrasound, to check for damage in the patella, or fluid buildup and swelling in the bursa. How is this treated? This condition may be treated by: Resting the knee. Putting ice on the knee. Taking medicines, such as: NSAIDs. These can help to reduce pain and swelling. Antibiotics. These may be needed if you have an infection. Steroids. These are used to reduce swelling and inflammation, and may be prescribed if other treatments are not helping. Raising (elevating) the knee while resting. Doing exercises to help you maintain movement (physical therapy). These may be recommended after pain and swelling improve. Having a procedure to remove fluid from the bursa. This may be done if other treatments are not helping. Having surgery to remove the bursa. This may be done if you have a severe infection or if the condition keeps coming back after treatment. Follow these instructions at home: Medicines Take over-the-counter and prescription medicines only as told by your health care provider. If you were prescribed an antibiotic medicine, take it as told by your health care provider. Do not stop taking the  antibiotic even if you start to feel better. Managing pain, stiffness, and swelling  If directed, put ice on the injured area. Put ice in a plastic bag. Place a towel between your skin and the bag. Leave the ice on for 20 minutes, 2-3 times a day. Elevate the injured area above the level of your heart while you are sitting  or lying down. Activity Do not use the injured limb to support your body weight until your health care provider says that you can. Rest your knee. Avoid activities that cause knee pain. Return to your normal activities as told by your health care provider. Ask your health care provider what activities are safe for you. Do exercises as told by your health care provider. General instructions Ask your health care provider when it is safe for you to drive. Do not use any products that contain nicotine or tobacco, such as cigarettes, e-cigarettes, and chewing tobacco. These can delay healing. If you need help quitting, ask your health care provider. Keep all follow-up visits as told by your health care provider. This is important. How is this prevented? Warm up and stretch before being active. Cool down and stretch after being active. Give your body time to rest between periods of activity. Maintain physical fitness, including strength and flexibility. Be safe and responsible while being active. This will help you to avoid falls. Wear knee pads if you have to kneel for a long period of time. Contact a health care provider if: Your symptoms do not improve or get worse. Your symptoms keep coming back after treatment. You develop a fever and have warmth, redness, or swelling over your knee. Summary Prepatellar bursitis is inflammation of the prepatellar bursa, which is a fluid-filled sac that cushions the kneecap (patella). This condition may be caused by injury or constant pressure on the knee. It may also be caused by an infection from bacteria. Symptoms of this condition include pain, swelling, warmth, and tenderness in the knee. Follow instructions from your health care provider about taking medicines, resting, and doing activities. Contact your health care provider if your symptoms do not improve, get worse, or keep coming back after treatment. This information is not intended to replace  advice given to you by your health care provider. Make sure you discuss any questions you have with your health care provider. Document Revised: 05/17/2018 Document Reviewed: 04/04/2018 Elsevier Patient Education  2022 [employer].

## 2021-01-06 ENCOUNTER — Other Ambulatory Visit: Payer: Self-pay

## 2021-01-06 ENCOUNTER — Ambulatory Visit (HOSPITAL_COMMUNITY)
Admission: EM | Admit: 2021-01-06 | Discharge: 2021-01-06 | Disposition: A | Discharge status: Home / Self Care | Payer: 59 | Attending: Emergency Medicine | Admitting: Emergency Medicine

## 2021-01-06 ENCOUNTER — Encounter (HOSPITAL_COMMUNITY): Payer: Self-pay

## 2021-01-06 DIAGNOSIS — J020 Streptococcal pharyngitis: Secondary | ICD-10-CM | POA: Diagnosis not present

## 2021-01-06 LAB — POCT RAPID STREP A, ED / UC: Streptococcus, Group A Screen (Direct): POSITIVE — AB

## 2021-01-06 MED ORDER — LIDOCAINE VISCOUS HCL 2 % MT SOLN: 15.0000 mL | OROMUCOSAL | 0 refills | Status: DC | PRN

## 2021-01-06 MED ORDER — AMOXICILLIN 500 MG PO CAPS: 500.0000 mg | ORAL_CAPSULE | Freq: Two times a day (BID) | ORAL | 0 refills | Status: AC

## 2021-01-06 NOTE — Discharge Instructions (Signed)
Your rapid strep test today was positive   Take amoxicillin twice a day for the next 10 days  Continue use of ibuprofen for fever management and comfort  You may gargle and spit lidocaine solution every 4 hours as needed for temporary numbing effect  May follow-up in urgent care as needed for persistent or worsening symptoms

## 2021-01-06 NOTE — ED Triage Notes (Signed)
Pt presents with a sore throat and fever x 2 days.  States he had trouble sleeping last night and c/o it being hard to swallow.

## 2021-01-06 NOTE — ED Provider Notes (Signed)
[facility]    CSN: [id number] Arrival date & time: 01/06/21  1601      History   Chief Complaint Chief Complaint  Patient presents with   Sore Throat    HPI Eric Osborne is a 30 y.o. male.   Patient presents with fevers, sore throat, nonproductive cough predominantly at nighttime and intermittent generalized headaches for 2 days.  Endorses pain from throat radiates into the right ear.  Tolerating food and liquids but painful swallowing.  Has attempted use of ibuprofen which is helpful. Known sick contacts.  Has chills, body aches, shortness of breath, wheezing, nasal congestion, rhinorrhea, abdominal pain, nausea, vomiting, diarrhea.  Past Medical History:  Diagnosis Date   Allergy    Depression    Hypertension     Patient Active Problem List   Diagnosis Date Noted   Insomnia 12/11/2013   Major depression 06/20/2013   Suicide attempt (HCC) 06/20/2013   MDD (major depressive disorder) 06/20/2013    Past Surgical History:  Procedure Laterality Date   NO PAST SURGERIES         Home Medications    Prior to Admission medications   Medication Sig Start Date End Date Taking? Authorizing Provider  pantoprazole (PROTONIX) 40 MG tablet Take 1 tablet (40 mg total) by mouth daily. 11/02/20   Georgina Quint, MD    Family History Family History  Problem Relation Age of Onset   Depression Mother    Hypertension Mother    Hypertension Father    Suicidality Neg Hx     Social History Social History   Tobacco Use   Smoking status: Former    Types: Cigarettes    Quit date: 08/07/2009    Years since quitting: 11.4   Smokeless tobacco: Never  Substance Use Topics   Alcohol use: No    Comment: 1-2 beers a week   Drug use: No     Allergies   Patient has no known allergies.   Review of Systems Review of Systems  Constitutional:  Positive for fever. Negative for activity change, appetite change, chills, diaphoresis, fatigue and unexpected  weight change.  HENT:  Positive for ear pain and sore throat. Negative for congestion, dental problem, drooling, ear discharge, facial swelling, hearing loss, mouth sores, nosebleeds, postnasal drip, rhinorrhea, sinus pressure, sinus pain, sneezing, tinnitus, trouble swallowing and voice change.   Respiratory:  Positive for cough. Negative for apnea, choking, chest tightness, shortness of breath, wheezing and stridor.   Cardiovascular: Negative.   Gastrointestinal: Negative.   Skin: Negative.   Neurological:  Positive for headaches. Negative for dizziness, tremors, seizures, syncope, facial asymmetry, speech difficulty, weakness, light-headedness and numbness.    Physical Exam Triage Vital Signs ED Triage Vitals [01/06/21 0824]  Enc Vitals Group     BP 133/84     Pulse Rate 84     Resp 19     Temp 98.4 F (36.9 C)     Temp Source Oral     SpO2 97 %     Weight      Height      Head Circumference      Peak Flow      Pain Score 4     Pain Loc      Pain Edu?      Excl. in GC?    No data found.  Updated Vital Signs BP 133/84 (BP Location: Left Arm)   Pulse 84   Temp 98.4 F (36.9 C) (Oral)  Resp 19   SpO2 97%   Visual Acuity Right Eye Distance:   Left Eye Distance:   Bilateral Distance:    Right Eye Near:   Left Eye Near:    Bilateral Near:     Physical Exam Constitutional:      Appearance: He is well-developed and normal weight.  HENT:     Head: Normocephalic.     Right Ear: Ear canal normal.     Left Ear: Tympanic membrane and ear canal normal.     Nose: No congestion or rhinorrhea.     Mouth/Throat:     Mouth: Mucous membranes are moist.     Pharynx: Posterior oropharyngeal erythema present.     Tonsils: Tonsillar exudate present. 2+ on the right. 2+ on the left.  Cardiovascular:     Rate and Rhythm: Normal rate and regular rhythm.     Heart sounds: Normal heart sounds.  Pulmonary:     Effort: Pulmonary effort is normal.     Breath sounds: Normal breath  sounds.  Musculoskeletal:     Cervical back: Normal range of motion and neck supple.  Skin:    General: Skin is warm and dry.  Neurological:     General: No focal deficit present.     Mental Status: He is alert and oriented to person, place, and time.  Psychiatric:        Mood and Affect: Mood normal.        Behavior: Behavior normal.     UC Treatments / Results  Labs (all labs ordered are listed, but only abnormal results are displayed) Labs Reviewed - No data to display  EKG   Radiology No results found.  Procedures Procedures (including critical care time)  Medications Ordered in UC Medications - No data to display  Initial Impression / Assessment and Plan / UC Course  I have reviewed the triage vital signs and the nursing notes.  Pertinent labs & imaging results that were available during my care of the patient were reviewed by me and considered in my medical decision making (see chart for details).  Strep pharyngitis  1.  Rapid strep positive 2.  Amoxicillin 500 mg twice daily for 10 days 3.  Lidocaine viscous 2% 15 mils every 4 hours as needed 4.  Over-the-counter medication for pain and fever management 5.  Return follow-up as needed Final Clinical Impressions(s) / UC Diagnoses   Final diagnoses:  None   Discharge Instructions   None    ED Prescriptions   None    PDMP not reviewed this encounter.   Valinda Hoar, [place] 01/06/21 [phone]

## 2021-02-27 NOTE — Progress Notes (Deleted)
Subjective:    Patient ID: Eric Osborne, male    DOB: Feb 24, 1990, 31 y.o.   MRN: [id number]  This visit occurred during the SARS-CoV-2 public health emergency.  Safety protocols were in place, including screening questions prior to the visit, additional usage of staff PPE, and extensive cleaning of exam room while observing appropriate contact time as indicated for disinfecting solutions.    HPI The patient is here for an acute visit.   Fatigue, check BP -    Medications and allergies reviewed with patient and updated if appropriate.  Patient Active Problem List   Diagnosis Date Noted   Insomnia 12/11/2013   Major depression 06/20/2013   Suicide attempt (HCC) 06/20/2013   MDD (major depressive disorder) 06/20/2013    Current Outpatient Medications on File Prior to Visit  Medication Sig Dispense Refill   lidocaine (XYLOCAINE) 2 % solution Use as directed 15 mLs in the mouth or throat as needed for mouth pain. 100 mL 0   pantoprazole (PROTONIX) 40 MG tablet Take 1 tablet (40 mg total) by mouth daily. 30 tablet 3   No current facility-administered medications on file prior to visit.    Past Medical History:  Diagnosis Date   Allergy    Depression    Hypertension     Past Surgical History:  Procedure Laterality Date   NO PAST SURGERIES      Social History   Socioeconomic History   Marital status: Married    Spouse name: Not on file   Number of children: Not on file   Years of education: Not on file   Highest education level: Not on file  Occupational History   Not on file  Tobacco Use   Smoking status: Former    Types: Cigarettes    Quit date: 08/07/2009    Years since quitting: 11.5   Smokeless tobacco: Never  Substance and Sexual Activity   Alcohol use: No    Comment: 1-2 beers a week   Drug use: No   Sexual activity: Not on file  Other Topics Concern   Not on file  Social History Narrative   Not on file   Social Determinants of Health    Financial Resource Strain: Not on file  Food Insecurity: Not on file  Transportation Needs: Not on file  Physical Activity: Not on file  Stress: Not on file  Social Connections: Not on file    Family History  Problem Relation Age of Onset   Depression Mother    Hypertension Mother    Hypertension Father    Suicidality Neg Hx     Review of Systems     Objective:  There were no vitals filed for this visit. BP Readings from Last 3 Encounters:  01/06/21 133/84  12/21/20 (!) 144/96  11/02/20 132/82   Wt Readings from Last 3 Encounters:  12/21/20 265 lb 3.2 oz (120.3 kg)  11/02/20 267 lb (121.1 kg)  05/18/18 260 lb (117.9 kg)   There is no height or weight on file to calculate BMI.   Physical Exam    Constitutional: Appears well-developed and well-nourished. No distress.  Head: Normocephalic and atraumatic.  Neck: Neck supple. No tracheal deviation present. No thyromegaly present.  No cervical lymphadenopathy Cardiovascular: Normal rate, regular rhythm and normal heart sounds.  No murmur heard. No carotid bruit .  No edema Pulmonary/Chest: Effort normal and breath sounds normal. No respiratory distress. No has no wheezes. No rales.  Skin: Skin is warm and  dry. Not diaphoretic.  Psychiatric: Normal mood and affect. Behavior is normal.       Assessment & Plan:    See Problem List for Assessment and Plan of chronic medical problems.

## 2021-02-28 ENCOUNTER — Ambulatory Visit: Payer: 59 | Admitting: Internal Medicine

## 2021-03-03 ENCOUNTER — Other Ambulatory Visit (HOSPITAL_COMMUNITY): Payer: Self-pay

## 2021-03-11 ENCOUNTER — Other Ambulatory Visit (HOSPITAL_COMMUNITY): Payer: Self-pay

## 2021-04-18 ENCOUNTER — Ambulatory Visit: Payer: 59 | Admitting: Nurse Practitioner

## 2021-07-16 DIAGNOSIS — J020 Streptococcal pharyngitis: Secondary | ICD-10-CM | POA: Diagnosis not present

## 2022-03-14 ENCOUNTER — Ambulatory Visit (INDEPENDENT_AMBULATORY_CARE_PROVIDER_SITE_OTHER): Payer: 59 | Admitting: Emergency Medicine

## 2022-03-14 ENCOUNTER — Other Ambulatory Visit (HOSPITAL_COMMUNITY): Payer: Self-pay

## 2022-03-14 ENCOUNTER — Encounter: Payer: Self-pay | Admitting: Emergency Medicine

## 2022-03-14 VITALS — BP 124/80 | HR 75 | Temp 98.2°F | Ht 72.0 in | Wt 283.1 lb

## 2022-03-14 DIAGNOSIS — R29818 Other symptoms and signs involving the nervous system: Secondary | ICD-10-CM | POA: Insufficient documentation

## 2022-03-14 DIAGNOSIS — K2100 Gastro-esophageal reflux disease with esophagitis, without bleeding: Secondary | ICD-10-CM | POA: Diagnosis not present

## 2022-03-14 DIAGNOSIS — F5101 Primary insomnia: Secondary | ICD-10-CM | POA: Diagnosis not present

## 2022-03-14 MED ORDER — PANTOPRAZOLE SODIUM 40 MG PO TBEC
40.0000 mg | DELAYED_RELEASE_TABLET | Freq: Every day | ORAL | 3 refills | Status: AC
  Filled 2022-03-14: qty 30, 30d supply, fill #0
  Filled 2022-06-29: qty 30, 30d supply, fill #1
  Filled 2022-09-11: qty 30, 30d supply, fill #2
  Filled 2022-11-29: qty 30, 30d supply, fill #3

## 2022-03-14 NOTE — Progress Notes (Signed)
Eric Osborne 32 y.o.   Chief Complaint  Patient presents with   Follow-up    F/u appt, patient states he has not been sleeping well, patient acid reflux is not better     HISTORY OF PRESENT ILLNESS: This is a 32 y.o. male complaining of intermittent frequent bouts of acid reflux and heartburn Also complaining of sleep problems.  May have sleep apnea.  Accompanied by wife today who states patient snores and moves a lot during sleep. May have had episodes of breathing problems.  Wakes up with headaches.  Has decreased energy during the day and some daytime sleepiness. No other complaints or medical concerns today.  HPI   Prior to Admission medications   Medication Sig Start Date End Date Taking? Authorizing Provider  lidocaine (XYLOCAINE) 2 % solution Use as directed 15 mLs in the mouth or throat as needed for mouth pain. 01/06/21   White, Eric Schuller, NP  pantoprazole (PROTONIX) 40 MG tablet Take 1 tablet (40 mg total) by mouth daily. 11/02/20   Eric Pollen, MD    No Known Allergies  Patient Active Problem List   Diagnosis Date Noted   Insomnia 12/11/2013   Major depression 06/20/2013   Suicide attempt ([place]) 06/20/2013   MDD (major depressive disorder) 06/20/2013    Past Medical History:  Diagnosis Date   Allergy    Depression    Hypertension     Past Surgical History:  Procedure Laterality Date   NO PAST SURGERIES      Social History   Socioeconomic History   Marital status: Married    Spouse name: Not on file   Number of children: Not on file   Years of education: Not on file   Highest education level: Not on file  Occupational History   Not on file  Tobacco Use   Smoking status: Former    Types: Cigarettes    Quit date: 08/07/2009    Years since quitting: 12.6   Smokeless tobacco: Never  Substance and Sexual Activity   Alcohol use: No    Comment: 1-2 beers a week   Drug use: No   Sexual activity: Not on file  Other Topics Concern   Not on  file  Social History Narrative   Not on file   Social Determinants of Health   Financial Resource Strain: Not on file  Food Insecurity: Not on file  Transportation Needs: Not on file  Physical Activity: Not on file  Stress: Not on file  Social Connections: Not on file  Intimate Partner Violence: Not on file    Family History  Problem Relation Age of Onset   Depression Mother    Hypertension Mother    Hypertension Father    Suicidality Neg Hx      Review of Systems  Constitutional: Negative.  Negative for chills and fever.  HENT: Negative.  Negative for congestion and sore throat.   Respiratory: Negative.  Negative for cough and shortness of breath.   Cardiovascular: Negative.  Negative for chest pain and palpitations.  Gastrointestinal:  Positive for heartburn. Negative for abdominal pain, blood in stool, melena, nausea and vomiting.  Genitourinary: Negative.  Negative for dysuria and hematuria.  Skin: Negative.  Negative for rash.  Neurological: Negative.  Negative for dizziness and headaches.  Psychiatric/Behavioral:  The patient has insomnia.   All other systems reviewed and are negative.  Today's Vitals   03/14/22 0842  BP: 124/80  Pulse: 75  Temp: 98.2 F (36.8 C)  TempSrc: Oral  SpO2: 97%  Weight: 283 lb 2 oz (128.4 kg)  Height: 6' (1.829 m)   Body mass index is 38.4 kg/m.   Physical Exam Vitals reviewed.  Constitutional:      Appearance: Normal appearance. He is obese.  HENT:     Head: Normocephalic.     Mouth/Throat:     Mouth: Mucous membranes are moist.     Pharynx: Oropharynx is clear.  Eyes:     Extraocular Movements: Extraocular movements intact.     Pupils: Pupils are equal, round, and reactive to light.  Cardiovascular:     Rate and Rhythm: Normal rate and regular rhythm.     Pulses: Normal pulses.     Heart sounds: Normal heart sounds.  Pulmonary:     Effort: Pulmonary effort is normal.     Breath sounds: Normal breath sounds.   Abdominal:     Palpations: Abdomen is soft.     Tenderness: There is no abdominal tenderness.  Musculoskeletal:     Cervical back: No tenderness.  Lymphadenopathy:     Cervical: No cervical adenopathy.  Skin:    General: Skin is warm and dry.  Neurological:     General: No focal deficit present.     Mental Status: He is alert and oriented to person, place, and time.  Psychiatric:        Mood and Affect: Mood normal.        Behavior: Behavior normal.      ASSESSMENT & PLAN: A total of 47 minutes was spent with the patient and counseling/coordination of care regarding preparing for this visit, review of most recent office visit notes, diagnosis of GERD and need for GI evaluation and possible upper endoscopy, review of all medications and changes made, possibility of sleep apnea and need for sleep studies, education on nutrition and foods to avoid with GERD, prognosis, documentation, and need for follow-up.  Problem List Items Addressed This Visit       Digestive   Gastroesophageal reflux disease with esophagitis without hemorrhage - Primary    Active and affecting quality of life. Diet and nutrition discussed.  Advised to identify triggers and avoid them.  Spicy food and tomato sauce are triggers he recognizes. Recommend to start pantoprazole 40 mg daily Recommend GI evaluation and possible upper endoscopy. Referral placed today.      Relevant Medications   pantoprazole (PROTONIX) 40 MG tablet   Other Relevant Orders   Ambulatory referral to Gastroenterology     Other   Insomnia    Most likely related to sleep apnea. Recommend 10 mg of melatonin at bedtime Needs referral for sleep studies      Suspected sleep apnea    Multiple symptoms compatible with sleep apnea. Needs sleep studies. Referral placed today.      Relevant Orders   Ambulatory referral to Sleep Studies   Patient Instructions  Food Choices for Gastroesophageal Reflux Disease, Adult When you have  gastroesophageal reflux disease (GERD), the foods you eat and your eating habits are very important. Choosing the right foods can help ease your discomfort. Think about working with a food expert (dietitian) to help you make good choices. What are tips for following this plan? Reading food labels Look for foods that are low in saturated fat. Foods that may help with your symptoms include: Foods that have less than 5% of daily value (DV) of fat. Foods that have 0 grams of trans fat. Cooking Do not fry your food.  Cook your food by baking, steaming, grilling, or broiling. These are all methods that do not need a lot of fat for cooking. To add flavor, try to use herbs that are low in spice and acidity. Meal planning  Choose healthy foods that are low in fat, such as: Fruits and vegetables. Whole grains. Low-fat dairy products. Lean meats, fish, and poultry. Eat small meals often instead of eating 3 large meals each day. Eat your meals slowly in a place where you are relaxed. Avoid bending over or lying down until 2-3 hours after eating. Limit high-fat foods such as fatty meats or fried foods. Limit your intake of fatty foods, such as oils, butter, and shortening. Avoid the following as told by your doctor: Foods that cause symptoms. These may be different for different people. Keep a food diary to keep track of foods that cause symptoms. Alcohol. Drinking a lot of liquid with meals. Eating meals during the 2-3 hours before bed. Lifestyle Stay at a healthy weight. Ask your doctor what weight is healthy for you. If you need to lose weight, work with your doctor to do so safely. Exercise for at least 30 minutes on 5 or more days each week, or as told by your doctor. Wear loose-fitting clothes. Do not smoke or use any products that contain nicotine or tobacco. If you need help quitting, ask your doctor. Sleep with the head of your bed higher than your feet. Use a wedge under the mattress or  blocks under the bed frame to raise the head of the bed. Chew sugar-free gum after meals. What foods should eat?  Eat a healthy, well-balanced diet of fruits, vegetables, whole grains, low-fat dairy products, lean meats, fish, and poultry. Each person is different. Foods that may cause symptoms in one person may not cause any symptoms in another person. Work with your doctor to find foods that are safe for you. The items listed above may not be a complete list of what you can eat and drink. Contact a food expert for more options. What foods should I avoid? Limiting some of these foods may help in managing the symptoms of GERD. Everyone is different. Talk with a food expert or your doctor to help you find the exact foods to avoid, if any. Fruits Any fruits prepared with added fat. Any fruits that cause symptoms. For some people, this may include citrus fruits, such as oranges, grapefruit, pineapple, and lemons. Vegetables Deep-fried vegetables. [place] fries. Any vegetables prepared with added fat. Any vegetables that cause symptoms. For some people, this may include tomatoes and tomato products, chili peppers, onions and garlic, and horseradish. Grains Pastries or quick breads with added fat. Meats and other proteins High-fat meats, such as fatty beef or pork, hot dogs, ribs, ham, sausage, salami, and bacon. Fried meat or protein, including fried fish and fried chicken. Nuts and nut butters, in large amounts. Dairy Whole milk and chocolate milk. Sour cream. Cream. Ice cream. Cream cheese. Milkshakes. Fats and oils Butter. Margarine. Shortening. Ghee. Beverages Coffee and tea, with or without caffeine. Carbonated beverages. Sodas. Energy drinks. Fruit juice made with acidic fruits, such as orange or grapefruit. Tomato juice. Alcoholic drinks. Sweets and desserts Chocolate and cocoa. Donuts. Seasonings and condiments Pepper. Peppermint and spearmint. Added salt. Any condiments, herbs, or  seasonings that cause symptoms. For some people, this may include curry, hot sauce, or vinegar-based salad dressings. The items listed above may not be a complete list of what you should not eat  and drink. Contact a food expert for more options. Questions to ask your doctor Diet and lifestyle changes are often the first steps that are taken to manage symptoms of GERD. If diet and lifestyle changes do not help, talk with your doctor about taking medicines. Where to find more information International Foundation for Gastrointestinal Disorders: aboutgerd.org Summary When you have GERD, food and lifestyle choices are very important in easing your symptoms. Eat small meals often instead of 3 large meals a day. Eat your meals slowly and in a place where you are relaxed. Avoid bending over or lying down until 2-3 hours after eating. Limit high-fat foods such as fatty meats or fried foods. This information is not intended to replace advice given to you by your health care provider. Make sure you discuss any questions you have with your health care provider. Document Revised: 08/04/2019 Document Reviewed: 08/04/2019 Elsevier Patient Education  Dick[place], MD [facility]

## 2022-03-14 NOTE — Assessment & Plan Note (Signed)
Most likely related to sleep apnea. Recommend 10 mg of melatonin at bedtime Needs referral for sleep studies

## 2022-03-14 NOTE — Assessment & Plan Note (Signed)
Active and affecting quality of life. Diet and nutrition discussed.  Advised to identify triggers and avoid them.  Spicy food and tomato sauce are triggers he recognizes. Recommend to start pantoprazole 40 mg daily Recommend GI evaluation and possible upper endoscopy. Referral placed today.

## 2022-03-14 NOTE — Patient Instructions (Signed)

## 2022-03-14 NOTE — Assessment & Plan Note (Signed)
Multiple symptoms compatible with sleep apnea. Needs sleep studies. Referral placed today.

## 2022-03-22 ENCOUNTER — Encounter: Payer: Self-pay | Admitting: Physician Assistant

## 2022-04-24 ENCOUNTER — Ambulatory Visit: Payer: 59 | Admitting: Physician Assistant

## 2022-05-24 DIAGNOSIS — S0501XA Injury of conjunctiva and corneal abrasion without foreign body, right eye, initial encounter: Secondary | ICD-10-CM | POA: Diagnosis not present

## 2022-05-26 DIAGNOSIS — S0501XD Injury of conjunctiva and corneal abrasion without foreign body, right eye, subsequent encounter: Secondary | ICD-10-CM | POA: Diagnosis not present

## 2022-06-26 ENCOUNTER — Ambulatory Visit: Payer: 59 | Admitting: Emergency Medicine

## 2022-06-26 ENCOUNTER — Other Ambulatory Visit (HOSPITAL_COMMUNITY): Payer: Self-pay

## 2022-06-26 VITALS — BP 120/78 | HR 70 | Temp 97.8°F | Ht 72.0 in | Wt 280.5 lb

## 2022-06-26 DIAGNOSIS — J302 Other seasonal allergic rhinitis: Secondary | ICD-10-CM | POA: Insufficient documentation

## 2022-06-26 MED ORDER — METHYLPREDNISOLONE 4 MG PO TBPK
4.0000 mg | ORAL_TABLET | ORAL | 1 refills | Status: DC
  Filled 2022-06-26: qty 21, 6d supply, fill #0

## 2022-06-26 NOTE — Progress Notes (Signed)
Eric Osborne 32 y.o.   Chief Complaint  Patient presents with   Allergies    Patient states his allergies is causing some SOB, really dry throat at night , Has been taking some OTC medication     HISTORY OF PRESENT ILLNESS: This is a 32 y.o. male with history of seasonal allergies complaining of dry throat and postnasal drip for the past couple days He is seeing over-the-counter antihistamines and Flonase but symptoms still persist. No other associated symptoms. No other complaints or medical concerns today.  HPI   Prior to Admission medications   Medication Sig Start Date End Date Taking? Authorizing Provider  lidocaine (XYLOCAINE) 2 % solution Use as directed 15 mLs in the mouth or throat as needed for mouth pain. 01/06/21  Yes Valinda Hoar, NP  methylPREDNISolone (MEDROL DOSEPAK) 4 MG TBPK tablet Sig as indicated 06/26/22  Yes Michie Molnar, Eilleen Kempf, MD  pantoprazole (PROTONIX) 40 MG tablet Take 1 tablet (40 mg total) by mouth daily. 03/14/22  Yes Georgina Quint, MD    No Known Allergies  Patient Active Problem List   Diagnosis Date Noted   Suspected sleep apnea 03/14/2022   Gastroesophageal reflux disease with esophagitis without hemorrhage 03/14/2022   Insomnia 12/11/2013   Major depression 06/20/2013   Suicide attempt ([facility]) 06/20/2013   MDD (major depressive disorder) 06/20/2013    Past Medical History:  Diagnosis Date   Allergy    Depression    Hypertension     Past Surgical History:  Procedure Laterality Date   NO PAST SURGERIES      Social History   Socioeconomic History   Marital status: Married    Spouse name: Not on file   Number of children: Not on file   Years of education: Not on file   Highest education level: 12th grade  Occupational History   Not on file  Tobacco Use   Smoking status: Former    Types: Cigarettes    Quit date: 08/07/2009    Years since quitting: 12.8   Smokeless tobacco: Never  Substance and Sexual Activity    Alcohol use: No    Comment: 1-2 beers a week   Drug use: No   Sexual activity: Not on file  Other Topics Concern   Not on file  Social History Narrative   Not on file   Social Determinants of Health   Financial Resource Strain: Low Risk  (06/26/2022)   Overall Financial Resource Strain (CARDIA)    Difficulty of Paying Living Expenses: Not hard at all  Food Insecurity: Patient Declined (06/26/2022)   Hunger Vital Sign    Worried About Running Out of Food in the Last Year: Patient declined    Ran Out of Food in the Last Year: Patient declined  Transportation Needs: No Transportation Needs (06/26/2022)   PRAPARE - Administrator, Civil Service (Medical): No    Lack of Transportation (Non-Medical): No  Physical Activity: Insufficiently Active (06/26/2022)   Exercise Vital Sign    Days of Exercise per Week: 1 day    Minutes of Exercise per Session: 20 min  Stress: No Stress Concern Present (06/26/2022)   [employer] of Occupational Health - Occupational Stress Questionnaire    Feeling of Stress : Only a little  Social Connections: Socially Isolated (06/26/2022)   Social Connection and Isolation Panel [NHANES]    Frequency of Communication with Friends and Family: Once a week    Frequency of Social Gatherings with Friends and  Family: Once a week    Attends Religious Services: Never    Database administrator or Organizations: No    Attends Engineer, structural: Not on file    Marital Status: Married  Catering manager Violence: Not on file    Family History  Problem Relation Age of Onset   Depression Mother    Hypertension Mother    Hypertension Father    Suicidality Neg Hx      Review of Systems  Constitutional: Negative.  Negative for chills and fever.  HENT:  Positive for congestion and sinus pain. Negative for ear pain, nosebleeds and sore throat.   Respiratory: Negative.  Negative for cough and shortness of breath.   Cardiovascular: Negative.   Negative for chest pain and palpitations.  Gastrointestinal:  Negative for abdominal pain, diarrhea, nausea and vomiting.  Genitourinary: Negative.  Negative for dysuria and hematuria.  Skin: Negative.  Negative for rash.  Neurological: Negative.  Negative for dizziness and headaches.  Endo/Heme/Allergies:  Positive for environmental allergies.  All other systems reviewed and are negative.   Vitals:   06/26/22 1538  BP: 120/78  Pulse: 70  Temp: 97.8 F (36.6 C)  SpO2: 98%    Physical Exam Vitals reviewed.  Constitutional:      Appearance: Normal appearance.  HENT:     Head: Normocephalic.     Mouth/Throat:     Mouth: Mucous membranes are moist.     Pharynx: Oropharynx is clear.  Eyes:     Extraocular Movements: Extraocular movements intact.     Pupils: Pupils are equal, round, and reactive to light.  Cardiovascular:     Rate and Rhythm: Normal rate and regular rhythm.     Pulses: Normal pulses.     Heart sounds: Normal heart sounds.  Pulmonary:     Effort: Pulmonary effort is normal.     Breath sounds: Normal breath sounds.  Musculoskeletal:     Cervical back: No tenderness.  Lymphadenopathy:     Cervical: No cervical adenopathy.  Skin:    General: Skin is warm and dry.  Neurological:     Mental Status: He is alert and oriented to person, place, and time.  Psychiatric:        Mood and Affect: Mood normal.        Behavior: Behavior normal.      ASSESSMENT & PLAN: A total of 32 minutes was spent with the patient and counseling/coordination of care regarding preparing for this visit, review of most recent office visit note, review of chronic medical conditions, review of all medications, diagnosis of allergic rhinitis and management, prognosis, documentation and need for follow-up.  Problem List Items Addressed This Visit       Respiratory   Seasonal allergic rhinitis - Primary    Active and affecting quality of life. Recommend to continue over-the-counter  Zyrtec or Claritin Continue Flonase Start using nasal saline sprays frequently during the day Recommend to start Medrol Dosepak Recommend to start over-the-counter Mucinex Advised to stay well-hydrated Advised to contact the office if no better or worse during the next several days       Relevant Medications   methylPREDNISolone (MEDROL DOSEPAK) 4 MG TBPK tablet   Patient Instructions  Allergic Rhinitis, Adult  Allergic rhinitis is a reaction to allergens. Allergens are things that can cause an allergic reaction. This condition affects the lining inside the nose (mucous membrane). There are two types of allergic rhinitis: Seasonal. This type is also called hay  fever. It happens only during some times of the year. Perennial. This type can happen at any time of the year. This condition cannot be spread from person to person (is not contagious). It can be mild, bad, or very bad. It can develop at any age and may be outgrown. What are the causes? Pollen from grasses, trees, and weeds. Other causes can be: Dust mites. Smoke. Mold. Car fumes. The pee (urine), spit, or dander of pets. Dander is dead skin cells from a pet. What increases the risk? You are more likely to develop this condition if: You have allergies in your family. You have problems like allergies in your family. You may have: Swelling of parts of your eyes and eyelids. Asthma. This affects how you breathe. Long-term redness and swelling on your skin. Food allergies. What are the signs or symptoms? The main symptom of this condition is a runny or stuffy nose (nasal congestion). Other symptoms may include: Sneezing or coughing. Itching and tearing of your eyes. Mucus that drips down the back of your throat (postnasal drip). This may cause a sore throat. Trouble sleeping. Feeling tired. Headache. How is this treated? There is no cure for this condition. You should avoid things that you are allergic to. Treatment can  help to relieve symptoms. This may include: Medicines that block allergy symptoms, such as anti-inflammatories or antihistamines. These may be given as a shot, nasal spray, or pill. Avoiding things you are allergic to. Medicines that give you some of what you are allergic to over time. This is called immunotherapy. It is done if other treatments do not help. You may get: Shots. Medicine under your tongue. Stronger medicines, if other treatments do not help. Follow these instructions at home: Avoiding allergens Find out what things you are allergic to and avoid them. To do this, try these things: If you get allergies any time of year: Replace carpet with wood, tile, or vinyl flooring. Carpet can trap pet dander and dust. Do not smoke. Do not allow smoking in your home. Change your heating and air conditioning filters at least once a month. If you get allergies only some times of the year: Keep windows closed when you can. Plan things to do outside when pollen counts are lowest. Check pollen counts before you plan things to do outside. When you come indoors, change your clothes and shower before you sit on furniture or bedding. If you are allergic to a pet: Keep the pet out of your bedroom. Vacuum, sweep, and dust often. General instructions Take over-the-counter and prescription medicines only as told by your doctor. Drink enough fluid to keep your pee pale yellow. Where to find more information American Academy of Allergy, Asthma & Immunology: aaaai.org Contact a doctor if: You have a fever. You get a cough that does not go away. You make high-pitched whistling sounds when you breathe, most often when you breathe out (wheeze). Your symptoms slow you down. Your symptoms stop you from doing your normal things each day. Get help right away if: You are short of breath. This symptom may be an emergency. Get help right away. Call 911. Do not wait to see if the symptom will go away. Do  not drive yourself to the hospital. This information is not intended to replace advice given to you by your health care provider. Make sure you discuss any questions you have with your health care provider. Document Revised: 10/03/2021 Document Reviewed: 10/03/2021 Elsevier Patient Education  2023 [employer].  Edwina Barth, MD [facility]

## 2022-06-26 NOTE — Patient Instructions (Signed)
Allergic Rhinitis, Adult  Allergic rhinitis is a reaction to allergens. Allergens are things that can cause an allergic reaction. This condition affects the lining inside the nose (mucous membrane). There are two types of allergic rhinitis: Seasonal. This type is also called hay fever. It happens only during some times of the year. Perennial. This type can happen at any time of the year. This condition cannot be spread from person to person (is not contagious). It can be mild, bad, or very bad. It can develop at any age and may be outgrown. What are the causes? Pollen from grasses, trees, and weeds. Other causes can be: Dust mites. Smoke. Mold. Car fumes. The pee (urine), spit, or dander of pets. Dander is dead skin cells from a pet. What increases the risk? You are more likely to develop this condition if: You have allergies in your family. You have problems like allergies in your family. You may have: Swelling of parts of your eyes and eyelids. Asthma. This affects how you breathe. Long-term redness and swelling on your skin. Food allergies. What are the signs or symptoms? The main symptom of this condition is a runny or stuffy nose (nasal congestion). Other symptoms may include: Sneezing or coughing. Itching and tearing of your eyes. Mucus that drips down the back of your throat (postnasal drip). This may cause a sore throat. Trouble sleeping. Feeling tired. Headache. How is this treated? There is no cure for this condition. You should avoid things that you are allergic to. Treatment can help to relieve symptoms. This may include: Medicines that block allergy symptoms, such as anti-inflammatories or antihistamines. These may be given as a shot, nasal spray, or pill. Avoiding things you are allergic to. Medicines that give you some of what you are allergic to over time. This is called immunotherapy. It is done if other treatments do not help. You may get: Shots. Medicine under  your tongue. Stronger medicines, if other treatments do not help. Follow these instructions at home: Avoiding allergens Find out what things you are allergic to and avoid them. To do this, try these things: If you get allergies any time of year: Replace carpet with wood, tile, or vinyl flooring. Carpet can trap pet dander and dust. Do not smoke. Do not allow smoking in your home. Change your heating and air conditioning filters at least once a month. If you get allergies only some times of the year: Keep windows closed when you can. Plan things to do outside when pollen counts are lowest. Check pollen counts before you plan things to do outside. When you come indoors, change your clothes and shower before you sit on furniture or bedding. If you are allergic to a pet: Keep the pet out of your bedroom. Vacuum, sweep, and dust often. General instructions Take over-the-counter and prescription medicines only as told by your doctor. Drink enough fluid to keep your pee pale yellow. Where to find more information American Academy of Allergy, Asthma & Immunology: aaaai.org Contact a doctor if: You have a fever. You get a cough that does not go away. You make high-pitched whistling sounds when you breathe, most often when you breathe out (wheeze). Your symptoms slow you down. Your symptoms stop you from doing your normal things each day. Get help right away if: You are short of breath. This symptom may be an emergency. Get help right away. Call 911. Do not wait to see if the symptom will go away. Do not drive yourself to the   hospital. This information is not intended to replace advice given to you by your health care provider. Make sure you discuss any questions you have with your health care provider. Document Revised: 10/03/2021 Document Reviewed: 10/03/2021 Elsevier Patient Education  2023 Elsevier Inc.  

## 2022-06-26 NOTE — Assessment & Plan Note (Signed)
Active and affecting quality of life. Recommend to continue over-the-counter Zyrtec or Claritin Continue Flonase Start using nasal saline sprays frequently during the day Recommend to start Medrol Dosepak Recommend to start over-the-counter Mucinex Advised to stay well-hydrated Advised to contact the office if no better or worse during the next several days

## 2022-06-29 ENCOUNTER — Other Ambulatory Visit (HOSPITAL_COMMUNITY): Payer: Self-pay

## 2022-07-31 ENCOUNTER — Ambulatory Visit (INDEPENDENT_AMBULATORY_CARE_PROVIDER_SITE_OTHER): Payer: 59 | Admitting: Neurology

## 2022-07-31 ENCOUNTER — Encounter: Payer: Self-pay | Admitting: Neurology

## 2022-07-31 VITALS — BP 133/81 | HR 82 | Ht 71.0 in | Wt 281.0 lb

## 2022-07-31 DIAGNOSIS — Z820 Family history of epilepsy and other diseases of the nervous system: Secondary | ICD-10-CM | POA: Diagnosis not present

## 2022-07-31 DIAGNOSIS — R0689 Other abnormalities of breathing: Secondary | ICD-10-CM | POA: Diagnosis not present

## 2022-07-31 DIAGNOSIS — G4719 Other hypersomnia: Secondary | ICD-10-CM

## 2022-07-31 DIAGNOSIS — G4700 Insomnia, unspecified: Secondary | ICD-10-CM

## 2022-07-31 DIAGNOSIS — R519 Headache, unspecified: Secondary | ICD-10-CM

## 2022-07-31 DIAGNOSIS — R635 Abnormal weight gain: Secondary | ICD-10-CM

## 2022-07-31 DIAGNOSIS — Z9189 Other specified personal risk factors, not elsewhere classified: Secondary | ICD-10-CM | POA: Diagnosis not present

## 2022-07-31 DIAGNOSIS — G479 Sleep disorder, unspecified: Secondary | ICD-10-CM | POA: Diagnosis not present

## 2022-07-31 DIAGNOSIS — R0683 Snoring: Secondary | ICD-10-CM

## 2022-07-31 NOTE — Patient Instructions (Signed)

## 2022-07-31 NOTE — Progress Notes (Signed)
Subjective:    Patient ID: Eric Osborne is a 32 y.o. male.  HPI    Huston Foley, MD, PhD [facility] [address] [address] [place], [place] [address]  Dear Dr. Alvy Bimler,  I saw your patient, Eric Osborne, upon your kind request in my sleep clinic today for initial consultation of his sleep disorder, in particular, concern for underlying obstructive sleep apnea.  The patient is unaccompanied today.  As you know, [name] is a 32 year old male with an underlying medical history of reflux disease, allergies, hypertension, depression and obesity, who reports snoring and excessive daytime somnolence.  Earlier this year, he had several episodes of waking up with a sense of gasping for air but he also suffered from increase in allergy symptoms and postnasal drip at the time.  He has gained quite a bit of weight within the past 2 years, in the realm of 30 pounds. He is a restless sleeper.  His Epworth sleepiness score is 6 out of 24, fatigue severity score is 27 out of 63.  He reports that his dad has sleep apnea and has a PAP machine.  Patient has some trouble going to sleep and staying asleep and recently started snoring, within the past year.  He reports having gained about 30 pounds within the past 2 years.  He lives with his wife and 49-year-old son and they are expecting their second son anytime soon, due date is 08/07/2022.  They have no pets at the house.  He goes to bed usually around 9 but it may be sometime before he can fall asleep.  He has recently started melatonin, uses 1 gummy typically, unsure of the exact strength.  I reviewed your office note from 03/14/2022. He has no nightly nocturia but has occasionally woken up with a headache in the mornings and also after napping.  He works usually from 8-5, he has his own remodeling/construction business.  He is usually quite active at work.  His rise time is generally between 6:30 AM and 7 AM.  He has a TV in his  bedroom and it is occasionally on at night.  He drinks caffeine in the form of sweet tea or soda, usually 1 large serving with lunch.  He drinks alcohol occasionally.  He is essentially a non-smoker, smoked a little bit several years ago, never regular smoker.  His Past Medical History Is Significant For: Past Medical History:  Diagnosis Date   Allergy    Depression    Hypertension     His Past Surgical History Is Significant For: Past Surgical History:  Procedure Laterality Date   NO PAST SURGERIES      His Family History Is Significant For: Family History  Problem Relation Age of Onset   Depression Mother    Hypertension Mother    Hypertension Father    Sleep apnea Father     His Social History Is Significant For: Social History   Socioeconomic History   Marital status: Married    Spouse name: Not on file   Number of children: Not on file   Years of education: Not on file   Highest education level: 12th grade  Occupational History   Not on file  Tobacco Use   Smoking status: Former    Types: Cigarettes    Quit date: 08/07/2009    Years since quitting: 12.9   Smokeless tobacco: Never   Tobacco comments:    Nicotine pouches sometimes   Substance and Sexual  Activity   Alcohol use: Yes    Comment: 1-2 beers a week every once in awhile, none in 3 weeks   Drug use: No   Sexual activity: Not on file  Other Topics Concern   Not on file  Social History Narrative   Caffeine: red bull and coffee "here and there", tea daily    Social Determinants of Health   Financial Resource Strain: Low Risk  (06/26/2022)   Overall Financial Resource Strain (CARDIA)    Difficulty of Paying Living Expenses: Not hard at all  Food Insecurity: Patient Declined (06/26/2022)   Hunger Vital Sign    Worried About Running Out of Food in the Last Year: Patient declined    Ran Out of Food in the Last Year: Patient declined  Transportation Needs: No Transportation Needs (06/26/2022)   PRAPARE -  Administrator, Civil Service (Medical): No    Lack of Transportation (Non-Medical): No  Physical Activity: Insufficiently Active (06/26/2022)   Exercise Vital Sign    Days of Exercise per Week: 1 day    Minutes of Exercise per Session: 20 min  Stress: No Stress Concern Present (06/26/2022)   [employer] of Occupational Health - Occupational Stress Questionnaire    Feeling of Stress : Only a little  Social Connections: Socially Isolated (06/26/2022)   Social Connection and Isolation Panel [NHANES]    Frequency of Communication with Friends and Family: Once a week    Frequency of Social Gatherings with Friends and Family: Once a week    Attends Religious Services: Never    Database administrator or Organizations: No    Attends Engineer, structural: Not on file    Marital Status: Married    His Allergies Are:  No Known Allergies:   His Current Medications Are:  Outpatient Encounter Medications as of 07/31/2022  Medication Sig   pantoprazole (PROTONIX) 40 MG tablet Take 1 tablet (40 mg total) by mouth daily.   [DISCONTINUED] lidocaine (XYLOCAINE) 2 % solution Use as directed 15 mLs in the mouth or throat as needed for mouth pain. (Patient not taking: Reported on 07/31/2022)   [DISCONTINUED] methylPREDNISolone (MEDROL DOSEPAK) 4 MG TBPK tablet Follow package directions. (Patient not taking: Reported on 07/31/2022)   No facility-administered encounter medications on file as of 07/31/2022.  :   Review of Systems:  Out of a complete 14 point review of systems, all are reviewed and negative with the exception of these symptoms as listed below:  Review of Systems  Neurological:        Patient is here alone for sleep consult. He states he has trouble going to sleep and staying asleep. He has also recently started snoring. He takes Melatonin sometimes. Sometimes he wakes up with a headache. Today he is tired because he didn't sleep well last night. He states he has  never had a sleep study. His father uses a PAP machine. ESS 6, FSS 27.    Objective:  Neurological Exam  Physical Exam Physical Examination:   Vitals:   07/31/22 1511  BP: 133/81  Pulse: 82    General Examination: The patient is a very pleasant 32 y.o. male in no acute distress. He appears well-developed and well-nourished and well groomed.   HEENT: Normocephalic, atraumatic, pupils are equal, round and reactive to light, extraocular tracking is good without limitation to gaze excursion or nystagmus noted. Hearing is grossly intact. Face is symmetric with normal facial animation. Speech is clear with no dysarthria  noted. There is no hypophonia. There is no lip, neck/head, jaw or voice tremor. Neck is supple with full range of passive and active motion. There are no carotid bruits on auscultation. Oropharynx exam reveals: No significant mouth dryness, good dental hygiene, significant airway crowding secondary to Mallampati class II and about 3+ tonsils bilaterally, slightly larger uvula.  Tongue protrudes centrally and palate elevates symmetrically.  Neck circumference 18 three-quarter inches.  No significant overbite.    Chest: Clear to auscultation without wheezing, rhonchi or crackles noted.  Heart: S1+S2+0, regular and normal without murmurs, rubs or gallops noted.   Abdomen: Soft, non-tender and non-distended.  Extremities: There is no pitting edema in the distal lower extremities bilaterally.   Skin: Warm and dry without trophic changes noted.   Musculoskeletal: exam reveals no obvious joint deformities.   Neurologically:  Mental status: The patient is awake, alert and oriented in all 4 spheres. His immediate and remote memory, attention, language skills and fund of knowledge are appropriate. There is no evidence of aphasia, agnosia, apraxia or anomia. Speech is clear with normal prosody and enunciation. Thought process is linear. Mood is normal and affect is normal.  Cranial  nerves II - XII are as described above under HEENT exam.  Motor exam: Normal bulk, strength and tone is noted. There is no obvious action or resting tremor.  Fine motor skills and coordination: grossly intact.  Cerebellar testing: No dysmetria or intention tremor. There is no truncal or gait ataxia.  Sensory exam: intact to light touch in the upper and lower extremities.  Gait, station and balance: He stands easily. No veering to one side is noted. No leaning to one side is noted. Posture is age-appropriate and stance is narrow based. Gait shows normal stride length and normal pace. No problems turning are noted.   Assessment and Plan:  In summary, Fields Neels is a very pleasant 32 y.o.-year old male with an underlying medical history of reflux disease, allergies, hypertension, depression and obesity, whose history and physical exam are concerning for sleep disordered breathing, particularly obstructive sleep apnea (OSA). A laboratory attended sleep study is typically considered "gold standard" for evaluation of sleep disordered breathing.   I had a long chat with the patient about my findings and the diagnosis of sleep apnea, particularly OSA, its prognosis and treatment options. We talked about medical/conservative treatments, surgical interventions and non-pharmacological approaches for symptom control. I explained, in particular, the risks and ramifications of untreated moderate to severe OSA, especially with respect to developing cardiovascular disease down the road, including congestive heart failure (CHF), difficult to treat hypertension, cardiac arrhythmias (particularly A-fib), neurovascular complications including TIA, stroke and dementia. Even type 2 diabetes has, in part, been linked to untreated OSA. Symptoms of untreated OSA may include (but may not be limited to) daytime sleepiness, nocturia (i.e. frequent nighttime urination), memory problems, mood irritability and suboptimally controlled  or worsening mood disorder such as depression and/or anxiety, lack of energy, lack of motivation, physical discomfort, as well as recurrent headaches, especially morning or nocturnal headaches. We talked about the importance of maintaining a healthy lifestyle and striving for healthy weight.  In addition, we talked about the importance of striving for and maintaining good sleep hygiene. I recommended a sleep study at this time. I outlined the differences between a laboratory attended sleep study which is considered more comprehensive and accurate over the option of a home sleep test (HST); the latter may lead to underestimation of sleep disordered breathing in some  instances and does not help with diagnosing upper airway resistance syndrome and is not accurate enough to diagnose primary central sleep apnea typically. I outlined possible surgical and non-surgical treatment options of OSA, including the use of a positive airway pressure (PAP) device (i.e. CPAP, AutoPAP/APAP or BiPAP in certain circumstances), a custom-made dental device (aka oral appliance, which would require a referral to a specialist dentist or orthodontist typically, and is generally speaking not considered for patients with full dentures or edentulous state), upper airway surgical options, such as traditional UPPP (which is not considered a first-line treatment) or the Inspire device (hypoglossal nerve stimulator, which would involve a referral for consultation with an ENT surgeon, after careful selection, following inclusion criteria - also not first-line treatment). I explained the PAP treatment option to the patient in detail, as this is generally considered first-line treatment.  The patient indicated that he would be willing to try PAP therapy, if the need arises. I explained the importance of being compliant with PAP treatment, not only for insurance purposes but primarily to improve patient's symptoms symptoms, and for the patient's long  term health benefit, including to reduce His cardiovascular risks longer-term.    We will pick up our discussion about the next steps and treatment options after testing.  We will keep him posted as to the test results by phone call and/or MyChart messaging where possible.  We will plan to follow-up in sleep clinic accordingly as well.  I answered all his questions today and the patient was in agreement.   I encouraged him to call with any interim questions, concerns, problems or updates or email [place] through MyChart.  Generally speaking, sleep test authorizations may take up to 2 weeks, sometimes less, sometimes longer, the patient is encouraged to get in touch with [place] if they do not hear back from the sleep lab staff directly within the next 2 weeks.  Thank you very much for allowing me to participate in the care of this nice patient. If I can be of any further assistance to you please do not hesitate to call me at [phone].  Sincerely,   Huston Foley, MD, PhD

## 2022-08-22 ENCOUNTER — Telehealth: Payer: Self-pay | Admitting: Neurology

## 2022-08-22 NOTE — Telephone Encounter (Signed)
[facility]- [facility] aetna no auth req.  Patient is scheduled at [facility] for 10/09/22 at 8 pm  Mailed packet to the patient.

## 2022-09-11 ENCOUNTER — Other Ambulatory Visit (HOSPITAL_COMMUNITY): Payer: Self-pay

## 2022-10-11 NOTE — Addendum Note (Signed)
Addended by: Huston Foley on: 10/11/2022 04:41 PM   Modules accepted: Orders

## 2022-10-11 NOTE — Telephone Encounter (Signed)
I mean his sleep study that he canceled from 10/09/22.

## 2022-10-11 NOTE — Telephone Encounter (Signed)
Patient was not able to make it to her 10/10/22 sleep study appointment due to he tested positive for covid. I spoke with patient wife and reschedule his appointment for Sunday 12/31/2022 but she is wondering if the patient can do a home sleep study instead since that is still far out. I did offer to put her on the cancellation list as well.

## 2022-10-11 NOTE — Telephone Encounter (Signed)
We can go ahead and proceed with a home sleep test, order placed.

## 2022-10-12 NOTE — Telephone Encounter (Signed)
Noted, I spoke with the patient wife and informed her that Dr. Frances Furbish has placed a HST. He is scheduled for a mail out on 10/17/22. She is aware once he receives the device to do it within a week.

## 2022-10-17 ENCOUNTER — Ambulatory Visit: Payer: 59

## 2022-10-17 DIAGNOSIS — G4733 Obstructive sleep apnea (adult) (pediatric): Secondary | ICD-10-CM

## 2022-10-17 DIAGNOSIS — R0689 Other abnormalities of breathing: Secondary | ICD-10-CM

## 2022-10-17 DIAGNOSIS — G4700 Insomnia, unspecified: Secondary | ICD-10-CM

## 2022-10-17 DIAGNOSIS — G479 Sleep disorder, unspecified: Secondary | ICD-10-CM

## 2022-10-17 DIAGNOSIS — R519 Headache, unspecified: Secondary | ICD-10-CM

## 2022-10-17 DIAGNOSIS — G4719 Other hypersomnia: Secondary | ICD-10-CM

## 2022-10-17 DIAGNOSIS — Z9189 Other specified personal risk factors, not elsewhere classified: Secondary | ICD-10-CM

## 2022-10-17 DIAGNOSIS — R635 Abnormal weight gain: Secondary | ICD-10-CM

## 2022-10-17 DIAGNOSIS — R0683 Snoring: Secondary | ICD-10-CM

## 2022-10-17 DIAGNOSIS — Z820 Family history of epilepsy and other diseases of the nervous system: Secondary | ICD-10-CM

## 2022-10-25 NOTE — Addendum Note (Signed)
Addended by: Huston Foley on: 10/25/2022 05:32 PM   Modules accepted: Orders

## 2022-10-25 NOTE — Procedures (Signed)
[facility]  HOME SLEEP TEST (Watch PAT) REPORT - Mail-out Device  STUDY DATE: 10/22/22  DOB: [birth year]  MRN: [id number]  ORDERING CLINICIAN: Huston Foley, MD, PhD   REFERRING CLINICIAN: Georgina Quint, MD   CLINICAL INFORMATION/HISTORY: 32 year old male with an underlying medical history of reflux disease, allergies, hypertension, depression and obesity, who reports snoring and excessive daytime somnolence.   Epworth sleepiness score: 6/24.  BMI: 39.2 kg/m  FINDINGS:   Sleep Summary:   Total Recording Time (hours, min): 7 hours, 50 min  Total Sleep Time (hours, min):  6 hours, 28 min  Percent REM (%):    27.3%   Respiratory Indices:   Calculated pAHI (per hour):  13.3/hour         REM pAHI:    27.9/hour       NREM pAHI: 7.8/hour  Central pAHI: 0/hour  Oxygen Saturation Statistics:    Oxygen Saturation (%) Mean: 95%   Minimum oxygen saturation (%):                 86%   O2 Saturation Range (%): 86 - 99%    O2 Saturation (minutes) <=88%: 0.2 min  Pulse Rate Statistics:   Pulse Mean (bpm):    59/min    Pulse Range (46 - 94/min)   IMPRESSION: OSA (obstructive sleep apnea), mild   RECOMMENDATION:  This home sleep test demonstrates overall mild obstructive sleep apnea with a total AHI of 13.3/hour and O2 nadir of 86%. Snoring was detected, ranging from mild to loud. Given the patient's medical history and sleep related complaints, therapy with a positive airway pressure device is recommended. Treatment can be achieved in the form of autoPAP trial/titration at home for now. A full night, in-lab PAP titration study may aid in improving proper treatment settings and with mask fit, if needed, down the road. Alternative treatments may include weight loss (where appropriate) along with avoidance of the supine sleep position (if possible), or an oral appliance in appropriate candidates.   Please note that untreated obstructive sleep apnea  may carry additional perioperative morbidity. Patients with significant obstructive sleep apnea should receive perioperative PAP therapy and the surgeons and particularly the anesthesiologist should be informed of the diagnosis and the severity of the sleep disordered breathing. The patient should be cautioned not to drive, work at heights, or operate dangerous or heavy equipment when tired or sleepy. Review and reiteration of good sleep hygiene measures should be pursued with any patient. Other causes of the patient's symptoms, including circadian rhythm disturbances, an underlying mood disorder, medication effect and/or an underlying medical problem cannot be ruled out based on this test. Clinical correlation is recommended.  The patient and his referring provider will be notified of the test results. The patient will be seen in follow up in sleep clinic at [facility], as necessary.  I certify that I have reviewed the raw data recording prior to the issuance of this report in accordance with the standards of the American Academy of Sleep Medicine (AASM).  INTERPRETING PHYSICIAN:   Huston Foley, MD, PhD Medical Director, [facility]) Diplomat, ABPN (Neurology and Sleep)   [facility] [address], [address] [place], [place] [address] [phone]

## 2022-10-25 NOTE — Progress Notes (Signed)
See procedure note.

## 2022-10-26 ENCOUNTER — Telehealth: Payer: Self-pay | Admitting: Neurology

## 2022-10-26 NOTE — Telephone Encounter (Signed)
I called pt. I advised pt that Dr. Frances Furbish reviewed their sleep study results and found that pt has mild OSA. Dr. Frances Furbish recommends that pt starts auto CPAP. I reviewed PAP compliance expectations with the pt. Pt is agreeable to starting a CPAP. I advised pt that an order will be sent to a DME, [facility], and [facility] will call the pt within about one week after they file with the pt's insurance. [facility] will show the pt how to use the machine, fit for masks, and troubleshoot the CPAP if needed. A follow up appt will need to be made for insurance purposes with NP within 31-90 days from the date of set up. Pt verbalized understanding to arrive 15 minutes early and bring their CPAP. Pt verbalized understanding of results. Pt had no questions at this time but was encouraged to call back if questions arise. I have sent the order to [facility] and have received confirmation that they have received the order.

## 2022-10-26 NOTE — Telephone Encounter (Signed)
-----   Message from Huston Foley sent at 10/25/2022  5:32 PM EDT ----- Patient referred by PCP, seen by me on 07/31/22, patient had HST on 10/22/22.    Please call and notify the patient that the recent home sleep test showed obstructive sleep apnea. OSA is overall mild, but worth treating to see if he feels better after treatment. To that end I recommend treatment for this in the form of autoPAP, which means, that we don't have to bring him in for a sleep study with CPAP, but will let him try an autoPAP machine at home, through a DME company (of his choice, or as per insurance requirement). The DME representative will educate him on how to use the machine, how to put the mask on, etc. I have placed an order in the chart. Please send referral, talk to patient, send report to referring MD. We will need a FU in sleep clinic in about 2.-3 months post-PAP set up (which is usually an insurance-mandated appointment to monitor compliance), please arrange that with me or one of our NPs. Please also go over the need for compliance with treatment (including the insurance-imposed minimum compliance percentage). Thanks,   Huston Foley, MD, PhD [facility])

## 2022-12-11 ENCOUNTER — Other Ambulatory Visit (HOSPITAL_COMMUNITY): Payer: Self-pay

## 2022-12-18 ENCOUNTER — Ambulatory Visit: Payer: Self-pay | Admitting: Emergency Medicine
